# Patient Record
Sex: Female | Born: 1950 | Race: White | Hispanic: No | Marital: Married | Smoking: Never smoker
Health system: Southern US, Community
[De-identification: ages and names within clinical notes are randomized; demographics above are authoritative.]

## PROBLEM LIST (undated history)

## (undated) DIAGNOSIS — K589 Irritable bowel syndrome without diarrhea: Secondary | ICD-10-CM

## (undated) DIAGNOSIS — C569 Malignant neoplasm of unspecified ovary: Secondary | ICD-10-CM

## (undated) DIAGNOSIS — I10 Essential (primary) hypertension: Secondary | ICD-10-CM

## (undated) DIAGNOSIS — R413 Other amnesia: Secondary | ICD-10-CM

## (undated) HISTORY — DX: Essential (primary) hypertension: I10

## (undated) HISTORY — DX: Other amnesia: R41.3

## (undated) HISTORY — DX: Irritable bowel syndrome, unspecified: K58.9

## (undated) HISTORY — DX: Malignant neoplasm of unspecified ovary: C56.9

---

## 2001-04-26 ENCOUNTER — Other Ambulatory Visit: Admission: RE | Admit: 2001-04-26 | Discharge: 2001-04-26 | Payer: Self-pay | Admitting: Obstetrics and Gynecology

## 2001-05-01 ENCOUNTER — Ambulatory Visit (HOSPITAL_COMMUNITY): Admission: RE | Admit: 2001-05-01 | Discharge: 2001-05-01 | Payer: Self-pay | Admitting: Obstetrics and Gynecology

## 2001-05-01 ENCOUNTER — Encounter: Payer: Self-pay | Admitting: Obstetrics and Gynecology

## 2001-05-21 ENCOUNTER — Ambulatory Visit: Admission: RE | Admit: 2001-05-21 | Discharge: 2001-05-21 | Payer: Self-pay | Admitting: Gynecology

## 2001-05-24 ENCOUNTER — Encounter: Payer: Self-pay | Admitting: Gynecologic Oncology

## 2001-05-28 ENCOUNTER — Inpatient Hospital Stay (HOSPITAL_COMMUNITY): Admission: RE | Admit: 2001-05-28 | Discharge: 2001-05-31 | Payer: Self-pay | Admitting: Gynecologic Oncology

## 2001-05-28 ENCOUNTER — Encounter (INDEPENDENT_AMBULATORY_CARE_PROVIDER_SITE_OTHER): Payer: Self-pay

## 2001-06-04 ENCOUNTER — Ambulatory Visit: Admission: RE | Admit: 2001-06-04 | Discharge: 2001-06-04 | Payer: Self-pay | Admitting: Gynecology

## 2001-06-12 ENCOUNTER — Ambulatory Visit: Admission: RE | Admit: 2001-06-12 | Discharge: 2001-06-12 | Payer: Self-pay | Admitting: Gynecology

## 2001-07-09 ENCOUNTER — Ambulatory Visit: Admission: RE | Admit: 2001-07-09 | Discharge: 2001-07-09 | Payer: Self-pay | Admitting: Gynecology

## 2001-08-28 ENCOUNTER — Ambulatory Visit: Admission: RE | Admit: 2001-08-28 | Discharge: 2001-08-28 | Payer: Self-pay | Admitting: Gynecology

## 2001-09-20 ENCOUNTER — Encounter: Payer: Self-pay | Admitting: Gynecology

## 2001-09-20 ENCOUNTER — Ambulatory Visit (HOSPITAL_COMMUNITY): Admission: RE | Admit: 2001-09-20 | Discharge: 2001-09-20 | Payer: Self-pay | Admitting: Gynecology

## 2001-12-13 ENCOUNTER — Ambulatory Visit (HOSPITAL_COMMUNITY): Admission: RE | Admit: 2001-12-13 | Discharge: 2001-12-13 | Payer: Self-pay | Admitting: Gynecology

## 2001-12-13 ENCOUNTER — Encounter: Payer: Self-pay | Admitting: Gynecology

## 2002-02-11 ENCOUNTER — Ambulatory Visit: Admission: RE | Admit: 2002-02-11 | Discharge: 2002-02-11 | Payer: Self-pay | Admitting: Gynecology

## 2002-08-13 ENCOUNTER — Ambulatory Visit: Admission: RE | Admit: 2002-08-13 | Discharge: 2002-08-13 | Payer: Self-pay | Admitting: Gynecology

## 2003-02-11 ENCOUNTER — Ambulatory Visit: Admission: RE | Admit: 2003-02-11 | Discharge: 2003-02-11 | Payer: Self-pay | Admitting: Gynecology

## 2003-08-05 ENCOUNTER — Ambulatory Visit: Admission: RE | Admit: 2003-08-05 | Discharge: 2003-08-05 | Payer: Self-pay | Admitting: Gynecology

## 2003-08-12 ENCOUNTER — Ambulatory Visit (HOSPITAL_COMMUNITY): Admission: RE | Admit: 2003-08-12 | Discharge: 2003-08-12 | Payer: Self-pay | Admitting: Gynecology

## 2004-04-27 ENCOUNTER — Ambulatory Visit: Admission: RE | Admit: 2004-04-27 | Discharge: 2004-04-27 | Payer: Self-pay | Admitting: Gynecology

## 2004-07-13 ENCOUNTER — Ambulatory Visit: Payer: Self-pay | Admitting: Oncology

## 2004-12-28 ENCOUNTER — Ambulatory Visit: Payer: Self-pay | Admitting: Oncology

## 2005-03-29 ENCOUNTER — Ambulatory Visit: Payer: Self-pay | Admitting: Oncology

## 2005-04-07 ENCOUNTER — Ambulatory Visit: Admission: RE | Admit: 2005-04-07 | Discharge: 2005-04-07 | Payer: Self-pay | Admitting: Gynecology

## 2005-06-14 ENCOUNTER — Ambulatory Visit: Payer: Self-pay | Admitting: Oncology

## 2005-11-29 ENCOUNTER — Ambulatory Visit: Payer: Self-pay | Admitting: Oncology

## 2006-04-27 ENCOUNTER — Ambulatory Visit: Payer: Self-pay | Admitting: Oncology

## 2006-05-04 ENCOUNTER — Ambulatory Visit: Admission: RE | Admit: 2006-05-04 | Discharge: 2006-05-04 | Payer: Self-pay | Admitting: Gynecology

## 2006-12-06 ENCOUNTER — Ambulatory Visit: Payer: Self-pay | Admitting: Oncology

## 2007-04-23 ENCOUNTER — Ambulatory Visit: Payer: Self-pay | Admitting: Oncology

## 2007-05-01 ENCOUNTER — Ambulatory Visit: Admission: RE | Admit: 2007-05-01 | Discharge: 2007-05-01 | Payer: Self-pay | Admitting: Gynecology

## 2008-04-15 ENCOUNTER — Ambulatory Visit: Admission: RE | Admit: 2008-04-15 | Discharge: 2008-04-15 | Payer: Self-pay | Admitting: Gynecology

## 2009-04-05 ENCOUNTER — Encounter: Admission: RE | Admit: 2009-04-05 | Discharge: 2009-04-05 | Payer: Self-pay | Admitting: Gastroenterology

## 2009-06-04 ENCOUNTER — Ambulatory Visit: Admission: RE | Admit: 2009-06-04 | Discharge: 2009-06-04 | Payer: Self-pay | Admitting: Gynecology

## 2010-08-03 ENCOUNTER — Ambulatory Visit
Admission: RE | Admit: 2010-08-03 | Discharge: 2010-08-03 | Payer: Self-pay | Source: Home / Self Care | Admitting: Gynecology

## 2010-09-11 HISTORY — PX: VAGINAL HYSTERECTOMY: SUR661

## 2011-01-24 NOTE — Consult Note (Signed)
Whitney Thomas, BURAS             ACCOUNT NO.:  [id number]   MEDICAL RECORD NO.:  [id number]          PATIENT TYPE:  OUT   LOCATION:  GYN                          FACILITY:  [facility]   PHYSICIAN:  De Blanch, M.D.DATE OF BIRTH:  1951/03/26   DATE OF CONSULTATION:  04/15/2008  DATE OF DISCHARGE:                                 CONSULTATION   CHIEF COMPLAINT:  Ovarian cancer.   The patient returns today for continuing follow-up.  Since her last  visit, she has done well.  She denies any GI or GU symptoms, has no  pelvic pain, pressure, vaginal bleed or discharge.  She continues to use  Effexor with good management of her menopausal symptoms.  She has had  mammograms over the past year which were normal.   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  The patient underwent initial surgical resection and staging September  2002.  She subsequently received 3 cycles of carboplatin and Taxol  chemotherapy completed in December 2002.  Since then, she has been  followed with no evidence of recurrent disease.  She has had CA-125  values obtained which remain in the normal range.  Her last one was on  April 13, 2008, which was 11.4 units/mL.   PAST MEDICAL HISTORY:  Medical illnesses - none.   PAST SURGICAL HISTORY:  1. Exploratory laparotomy.  2. TAH-BSO and pelvic and periaortic lymphadenectomy and omentectomy      2002.   OBSTETRICAL HISTORY:  Gravida 1.   CURRENT MEDICATION:  Effexor 50 mg q.a.m.   DRUG ALLERGIES:  None.   SOCIAL HISTORY:  The patient is married.  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  She does not smoke.   FAMILY HISTORY:  Negative for gynecologic, breast or colon cancer.   REVIEW OF SYSTEMS:  A 10-point comprehensive review of systems negative  except as noted above.   PHYSICAL EXAMINATION:  VITAL SIGNS:  Weight 118 pounds, blood pressure  110/70, pulse 80, respiratory rate 20.  GENERAL:  The patient is a healthy  slender white female in no acute  distress.  HEENT:  Negative.  NECK:  Supple without thyromegaly.  There is no supraclavicular or  inguinal adenopathy.  ABDOMEN:  Soft, nontender.  No mass, organomegaly, ascites or hernias  noted.  PELVIC:  EGBUS, vagina, bladder and urethra are normal.  Cervix and  uterus surgically absent.  Bimanual and rectovaginal exam reveal no  adnexal masses, nodularity or tenderness.  LOWER EXTREMITIES:  Without edema or varicosities.   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   PLAN:  The patient will see Dellia Beckwith, M.D., in 6 months and  return to see [place] in 1 year.  We will continue to monitor CA-125.   The patient is strongly encouraged to go ahead and obtain colonoscopy in  the very near future.      De Blanch, M.D.  Electronically Signed     DC/MEDQ  D:  04/15/2008  T:  04/15/2008  Job:  [id number]  cc:   Telford Nab, R.N.  [address]  [place], [place] [address]   Dellia Beckwith, M.D.  Fax: [phone]

## 2011-01-24 NOTE — Consult Note (Signed)
Whitney Thomas, Whitney Thomas             ACCOUNT NO.:  [id number]   MEDICAL RECORD NO.:  [id number]          PATIENT TYPE:  OUT   LOCATION:  GYN                          FACILITY:  [facility]   PHYSICIAN:  De Blanch, M.D.DATE OF BIRTH:  August 22, 1951   DATE OF CONSULTATION:  DATE OF DISCHARGE:                                 CONSULTATION   CHIEF COMPLAINT:  Ovarian cancer.   INTERVAL HISTORY:  The patient returns for her annual examination and  followup of her ovarian cancer.  Since her last visit, she has done  well.  She denies any GI or GU symptoms, has no pelvic pain, pressure,  vaginal bleeding or discharge.  She continues to take Effexor for  management of menopausal symptoms and takes it in the morning  predominantly, to reduce her hot flashes during the day.  She had a  mammogram, was within the past year that were negative.  She has never  had colonoscopy.   HISTORY OF PRESENT ILLNESS:  Stage I-C clear cell carcinoma of the  ovary, undergoing initial surgical resection and staging September 2002.  She subsequently received 3 cycles of Carboplatin and Taxol  chemotherapy, completed in December of 2002.  She had been followed  since that time, with no evidence of recurrent disease.   She has recently had a CA-125 value on April 23, 2007, which was 12.9  units per mL.   PAST MEDICAL HISTORY:  Medical illnesses:  None.   PAST SURGICAL HISTORY:  Exploratory laparotomy, TH-BSO and staging for  ovarian cancer in 2002.   OBSTETRIC HISTORY:  Gravida 1.   CURRENT MEDICATIONS:  Effexor 50 mg q.a.m.   DRUG ALLERGIES:  NONE.   SOCIAL HISTORY:  The patient is married.  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  She does not smoke.   FAMILY HISTORY:  Negative for gynecologic, breast or colon cancer.   REVIEW OF SYSTEMS:  A 10-point comprehensive review of systems negative,  except as noted above.   PHYSICAL EXAMINATION:  VITAL SIGNS:  Weight 122 pounds, blood pressure  120/60,  pulse 80, respiratory rate 20.  GENERAL:  The patient is a healthy white female in no acute distress.  HEENT:  Negative.  NECK:  Supple without thyromegaly.  There is no supraclavicular or  inguinal adenopathy.  ABDOMEN:  Soft, nontender.  No masses, organomegaly, ascites or hernias  are noted.  PELVIC:  EG/BUS, vagina, bladder, urethra are normal.  Cervix and uterus  surgically absent.  Adnexa without masses.  Rectovaginal exam confirms.  LOWER EXTREMITIES:  Without edema or varicosities.   IMPRESSION:  Stage I-C clear cell carcinoma of the ovary, 2002.  No  evidence of recurrent disease.  The patient returns to see Dr. Gery Pray in 6 months, return to see [place] in one year.      De Blanch, M.D.  Electronically Signed     DC/MEDQ  D:  05/01/2007  T:  05/02/2007  Job:  [id number]   cc:   Telford Nab, R.N.  [address]  [place], [place] [address]   Miguel Aschoff, M.D.  Fax: [phone]   Dellia Beckwith, M.D.  Fax: [phone]

## 2011-01-27 NOTE — Consult Note (Signed)
NAME:  Whitney Thomas, Whitney Thomas                       ACCOUNT NO.:  [id number]   MEDICAL RECORD NO.:  [id number]                   PATIENT TYPE:  OUT   LOCATION:  GYN                                  FACILITY:  [facility]   PHYSICIAN:  De Blanch, M.D.         DATE OF BIRTH:  1950-10-15   DATE OF CONSULTATION:  DATE OF DISCHARGE:                                   CONSULTATION   HISTORY OF PRESENT ILLNESS:  This 60 year old white female returns for  continuing follow-up of stage IC clear cell carcinoma of the ovary.  Initial  diagnosis in staging was in September 2002.  Subsequently she received three  cycles of adjuvant Carboplatin and Taxol chemotherapy.  This was completed  in December of 2002.  She has been followed since that time with no evidence  of recurrent disease.  At her last visit with Dr. Gilman Buttner she had a normal  CA-125.   Currently she denies any GI or GU symptoms, has no pelvic pain, pressure,  vaginal or urinary discharge.  Her functional status was excellent.  She is  working full time.   FAMILY HISTORY/SOCIAL HISTORY:  Family history and social history are  reviewed and unchanged.   REVIEW OF SYSTEMS:  Reveals no cardiovascular, pulmonary, GI, GU,  neurologic, or musculoskeletal symptoms.   PHYSICAL EXAMINATION:  VITAL SIGNS:  Weight 123 pounds, blood pressure  120/70, pulse 80.  GENERAL:  The patient is a healthy white female in no acute distress.  HEENT:  Negative.  NECK:  Supple without thyromegaly.  There is no supraclavicular or inguinal  adenopathy.  ABDOMEN:  Soft, nontender, no mass or organomegaly, ascites, or hernias are  noted.  Midline incision is well-healed.  PELVIC:  EGBUS, vagina, bladder, and urethra are normal.  Cervix and uterus  surgically absent.  Adnexa without masses.  RECTAL:  Anus normal, stools guaiac negative.  EXTREMITIES:  Lower extremities are without edema or varicosities.   IMPRESSION:  Stage IC ovarian cancer, September  2002.  No evidence of  recurrent disease.   PLAN:  CA-125 will be obtained today.  The patient will return to see Dr.  Gilman Buttner in three months and return to see [place] in six months.                                                 De Blanch, M.D.    DC/MEDQ  D:  08/13/2002  T:  08/13/2002  Job:  [id number]   cc:   Dellia Beckwith, M.D.  [address]  [place]  [place] [address]  Fax: [phone]   Telford Nab, R.N.  [address] [place], [place] [address]  Fax: [phone]   Miguel Aschoff, M.D.  [address]  [place]  [place]  [address]  Fax:  [phone]  

## 2011-01-27 NOTE — Consult Note (Signed)
[facility]  Patient:    [name], Whitney Thomas Visit Number: [id number] MRN: [id number]          Service Type: GON Location: GYN Attending Physician:  Jeannette Corpus Dictated by:   Rande Brunt. Clarke-Pearson, M.D. Proc. Date: 06/12/01 Admit Date:  06/12/2001   CC:         Miguel Aschoff, M.D.  Dellia Beckwith, M.D.  [facility] [address] [place], [place] [address]   Consultation Report  REASON FOR CONSULTATION:  The patient is a 62 year old white female who returns to discuss adjuvant treatment of a stage IC clear cell carcinoma of the ovary.  I saw her last on June 05, 2001, at which time we had a lengthy discussion, and she was given information regarding GOG protocol for early stage ovarian cancer patients.  In the interim, the patient has had a number of family members give their input as to treatment, and she herself is concerned about hair loss and when she can return to work.  She is scheduled to see Dr. Gery Pray on June 20, 2001.  The patient has decided not to participate in the GOG protocol.  I therefore recommended that she be treated on the "standard" arm of the protocol which would be three cycles of carboplatin and Taxol.  I believe she could get started with this at any point in time.  The patient had a number of questions regarding side effects and ways to avoid side effects, details regarding how the chemotherapy is administered, and her expectation as to how much work she could do once she has recovered from surgery.  All of these questions are answered.  Consultation time was approximately 40 minutes.  They will see Dr. Gilman Buttner on June 20, 2001, and I will have her return to see me for a six week postoperative checkup, and then see her every three months. Dictated by:   Rande Brunt. Clarke-Pearson, M.D. Attending Physician:  Jeannette Corpus DD:  06/12/01 TD:  06/12/01 Job:  [id number]

## 2011-01-27 NOTE — Discharge Summary (Signed)
[facility]  Patient:    Whitney Thomas, Whitney Thomas Visit Number: [id number] MRN: [id number]          Service Type: GON Location: GYN Attending Physician:  Jeannette Corpus Dictated by:   Miguel Aschoff, M.D. Admit Date:  07/09/2001 Discharge Date: 07/09/2001                             Discharge Summary  ADMISSION DIAGNOSIS:  Large abdominal mass.  FINAL DIAGNOSIS:  Clear cell carcinoma of the ovary, probable stage IC.  OPERATIONS AND PROCEDURES: 1. Exploratory laparotomy. 2. Total abdominal hysterectomy. 3. Bilateral salpingo-oophorectomy. 4. Surgical staging of ovarian carcinoma.  BRIEF HISTORY:  The patient is a 60 year old white female, gravida 1, para 1, who on routine evaluation was noted to have a large abdominal mass. Ultrasound examination revealed the mass to be 21 x 11 x 13 cm.  In addition, the patients CA-125 was noted to be elevated at 70.  Her Pap smear was normal.  Because of this large mass, she was admitted to undergo exploratory laparotomy to rule out an ovarian malignancy.  HOSPITAL COURSE:  Preoperative studies were obtained.  This included an admission hemoglobin of 11.1, a hematocrit of 31.9, and a white count of 5700. The chemistry profile was essentially within normal limits.  The blood type was noted to be B+.  On May 30, 2001, under general anesthesia, an exploratory laparotomy was carried out.  The large mass was noted to be that of clear cell ovarian cancer.  During the surgical procedure, the large mass was ruptured and this was felt to account for the positive cytology.  The patient underwent pelvic and periaortic lymphadenectomy, omentectomy, and surgical staging.  Her postoperative course was essentially uncomplicated. She did tolerate increasing ambulation well.  DISPOSITION:  By May 31, 2001, she was in satisfactory condition and able to be sent home.  DISCHARGE MEDICATIONS: 1. Tylox one  every three hours as needed for pain. 2. Chromagen one p.o. daily to reconstitute her hemoglobin.  DIET:  She was sent home on a regular diet.  ACTIVITY:  Instructed to do no heavy lifting.  FOLLOW-UP:  The patient has an appointment to see Reuel Boom L. Clarke-Pearson, M.D., on June 04, 2001, to have her staples removed and to undergo consultation regarding further therapy for her ovarian carcinoma. Dictated by:   Miguel Aschoff, M.D. Attending Physician:  Jeannette Corpus DD:  07/13/01 TD:  07/15/01 Job: [id number]

## 2011-01-27 NOTE — Consult Note (Signed)
[facility]  Patient:    Whitney Thomas, Whitney Thomas Visit Number: [id number] MRN: [id number]          Service Type: GON Location: GYN Attending Physician:  Jeannette Corpus Dictated by:   Rande Brunt. Clarke-Pearson, M.D. Proc. Date: 07/09/01 Admit Date:  07/09/2001   CC:         Miguel Aschoff, M.D.  Dellia Beckwith, M.D.  Telford Nab, R.N.   Consultation Report  The patient is a 60 year old white female who returns for continuing followup, now approximately six weeks after undergoing surgical resection and staging of a stage IC clear cell carcinoma of the ovary.  She has now received one cycle of carboplatin and Taxol under the direction of Dr. Gery Pray in [place].  She tolerated the therapy reasonably well with approximately four days of symptoms which then resolved.  From a surgical point of view the patient is doing well.  Her energy level is good.  She is returning to nearly normal levels of activity.  She has no GI or GU symptoms.  REVIEW OF SYSTEMS:  Otherwise negative.  PHYSICAL EXAMINATION:  VITAL SIGNS:  Weight 107 pounds, blood pressure 122/68.  GENERAL:  The patient is a slender healthy white female in no acute distress.  HEENT:  Negative.  NECK:  Supple without thyromegaly.  ABDOMEN:  Soft, nontender, midline incision is well healed.  No masses, organomegaly, ascites, or hernias are noted.  PELVIC:  EGBUS normal.  The vagina is clean.  The cuff is still healing. Bimanual examination reveals postoperative induration without any discrete masses.  Rectovaginal examination confirms.  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  We plan on continuing her chemotherapy for two additional cycles.  She will return to the care of Dr. Gery Pray.  I will plan on seeing the patient back again  approximately 3 to 4 weeks after her last cycle of chemotherapy.  From a surgical point of view, the patient is given the okay to return to full levels of activity. Dictated by:   Rande Brunt. Clarke-Pearson, M.D. Attending Physician:  Jeannette Corpus DD:  07/10/01 TD:  07/10/01 Job: [id number]

## 2011-01-27 NOTE — Consult Note (Signed)
[facility]  Patient:    Whitney Thomas, Whitney Thomas Visit Number: [id number] MRN: [id number]          Service Type: GON Location: GYN Attending Physician:  Jeannette Corpus Dictated by:   Rande Brunt. Clarke-Pearson, M.D. Proc. Date: 05/31/01 Admit Date:  06/04/2001   CC:         Miguel Aschoff, M.D.  Dellia Beckwith, M.D., [facility], [place], [place]  Telford Nab, R.N.   Consultation Report  REASON FOR CONSULTATION:  [age]-year-old white female returns for initial postoperative followup and treatment planning.  She underwent exploratory laparotomy for a large pelvis mass on September 17th.  She was found to have a stage IC clear cell carcinoma of the ovary.  The ovarian capsule was ruptured as well as patient having positive cytology.  Extensive staging biopsies showed no other evidence of metastatic disease.  The patient had an uncomplicated postoperative course.  PHYSICAL EXAMINATION:  ABDOMEN:  Soft and nontender.  Her midline incision is healing well.  Staples are removed, Steri-Strips are applied.  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  PLAN:  I had a lengthy consultation with the patient and her husband regarding the surgical findings, pathologic findings, natural history of ovarian cancer and recommendations for adjuvant chemotherapy.  I indicated that standard of care would be to administer three cycles of carboplatin and Taxol chemotherapy.  I also provided them detailed information regarding the current GOG protocol utilizing carboplatin and Taxol for 3 cycles, followed by 24 weekly low-dose cycles of Taxol versus standard therapy of just 3 cycles of carboplatin and Taxol.  The side-effects of chemotherapy regimens were discussed in detail.  Patient and her husband were given the information sheet to consider and we will plan on meeting again next  week to make decisions about further therapy.  I did indicate that patient could certainly be treated very easily in [place] by Dr. Baxter Flattery.  All of their questions were answered and I will be prepared to answer more questions in a week. Dictated by:   Rande Brunt. Clarke-Pearson, M.D. Attending Physician:  Jeannette Corpus DD:  06/04/01 TD:  06/04/01 Job: [id number]

## 2011-01-27 NOTE — Op Note (Signed)
[facility]  Patient:    Whitney Thomas, Whitney Thomas Visit Number: [id number] MRN: [id number]          Service Type: GYN Location: 1S 0005 01 Attending Physician:  Sabino Donovan Dictated by:   Jackquline Denmark. Kyla Balzarine, M.D. Proc. Date: 05/28/01 Admit Date:  05/28/2001   CC:         Miguel Aschoff, M.D.  Telford Nab, R.N., Gyn Oncology unit at [facility]   Operative Report  PREOPERATIVE DIAGNOSIS:  Complex pelvic mass with elevated cancer antigen 125.  POSTOPERATIVE DIAGNOSIS:  Clear cell carcinoma of the ovary, at least stage I-C.  PROCEDURE:  TAH/BSO, omentectomy, multiple peritoneal biopsies and washings, bilateral pelvic and periaortic selective lymphadenectomy, appendectomy.  SURGEON:  John T. Kyla Balzarine, M.D.  ASSISTANT:  Miguel Aschoff, M.D.  ANESTHESIA:  General endotracheal.  FINDINGS AND INDICATIONS FOR PROCEDURE:  This is a 60 year old woman who presented with a large, complex cystic mass extending above the level of the umbilicus associated with an elevated CA 125 value.  At the time that the abdominal cavity was opened, a 20+ cm cystic mass replaced the left ovary, and was densely adherent to the left descending/sigmoid colon, pelvic side wall, and with reactive retroperitoneal fibrosis involving the left external iliac vessels and ureter.  During the course of dissection, this ruptured with spill of dark, old hemorrhagic fluid.  The uterus was essentially normal.  The right ovary had scarring and powder burn changes compatible with prior endometriosis.  Unfortunately, final frozen section results revealed a clear cell carcinoma of the left ovary.  There was no other gross evidence of metastatic disease by inspection or palpation of the intra-abdominal and retroperitoneal contents.  Comprehensive surgical staging for ovarian cancer was carried out with omentectomy, multiple peritoneal biopsies, omentectomy, washings, and both pelvic and  periaortic lymphadenectomy.  Incidental appendectomy was performed because of the high yield for occult metastatic disease in ovarian cancer patients.  DESCRIPTION OF PROCEDURE:  The patient was prepped and draped in the low lithotomy position using direct placement strips.  She was explored through a midline incision which was developed with sharp dissection and electrocautery, extending to the left at the umbilicus into the midepigastrium.  Manual and visual exploration of the abdominal cavity revealed the findings described above.  There was no ascites and a 20 cm smooth mass was densely adherent to the descending colon and the left pelvic side wall.  The lateral peritoneal reflection on the left side was opened, and with sharp and blunt dissection, ureters, and the iliac vessels identified along with the infundibulopelvic ligament.  This was skeletonized above the left ureter, cross-clamped, divided, and suture ligated.  All sutures and ligatures were 2-0 Vicryl unless otherwise specified.  Using sharp and blunt dissection, adhesions between the ovary and mesentery of the sigmoid colon were lysed.  During this dissection, there was spontaneous rupture of the cyst.  Sharp and blunt dissection was also used to liberate the ovary from the left external iliac vessels, left ureter, and left pelvic side wall.  The left utero-ovarian ligament and fallopian tubes were then cross-clamped and divided with the specimen handed off for frozen section pathology.  Bowel was packed out of the pelvis and a self-retaining retractor was used for exposure.  Total abdominal hysterectomy and right salpingo-oophorectomy was performed using 2-0 Vicryl for all sutures and ligatures unless otherwise specified.  The round ligaments bilaterally were divided with electrocautery.  The right posterior broad ligament was opened with electrocautery  and perirectal space partially developed.  The right ureter was  identified.  The right infundibulopelvic ligament was isolated the above the ureter, cross-clamped, divided, and ligated with free tie and suture ligature.  Adhesions of the right ovary to the posterior pelvis were lysed with sharp and blunt dissection, with the ureter under direct vision.  The anterior peritoneal reflection of the uterus was opened and uterine vessels bilaterally skeletonized, cross-clamped, divided, and suture ligated.  Alternating on the left and right sides, cardinal and uterosacral ligaments were developed by clamping adjacent to the cervix, dividing, and suture ligating.  Final pedicles entered the lateral fornices of the vagina.  The uterine specimen was amputated and vaginal apex was closed with locked running suture line.  At this juncture, frozen section returned as above.  Comprehensive surgical staging for ovarian cancer was performed.  Washings were obtained from all quadrants of the abdomen, Pap smear from the diaphragm, and pinch biopsies of the diaphragm were obtained.  The Bookwalter retractor was placed to afford greater exposure.  Multiple random biopsies were obtained from the site of adherence to the descending/sigmoid colon, left pelvic side wall, and additional biopsies were obtained from the anterior and posterior cul-de-sac and right pelvic side wall.  Strips of peritoneum from the right and left pericolic gutter were harvested.  The bowel was run in its entirety and biopsies were obtained from the mesentery of the ileum.  Partial omentectomy was performed, developing pedicles that were cross-clamped, divided, and suture ligated.  Bilateral selective pelvic lymphadenectomies were performed in the following manner; the paravesical and pararectal spaces were fully developed and final structures identified.  Lymph nodes anterior and medial to the external iliac vessels were harvested using sharp and blunt dissection with electrocautery for  hemostasis.  The external iliac vein was retracted anterior, obturator  nerve identified, and lymphatic tissue above the level of the obturator nerve harvested from the obturator space using sharp and blunt dissection with electrocautery for hemostasis.  Hot lap pads were placed in the pelvis.  The peritoneum over the root at the aorta was opened to the level of the retroperitoneal duodenum and above.  Using sharp and blunt dissection, the distal aorta was identified and right ureter retracted laterally, exposing the distal aorta and right common iliac vessels.  Using sharp and blunt dissection, lymph nodes anterior and lateral to the common iliac artery and distal aorta were harvested, clearing nodes off of the anterior vena cava. The dissection extended above the level of the retroperitoneal duodenum and inferior mesenteric artery.  No pathologically enlarged lymph nodes were encountered.  Hemoclips were used for hemostasis throughout.  The dissection continued across the presacral space and under the inferior mesenteric artery.  The left ureter was retracted anteriorly along with the sigmoid colon.  Using sharp and blunt dissection, the left common iliac lymph nodes anterior and lateral to this artery and left periaortic lymph nodes anterior and left of the aorta were harvested, extending well above the inferior mesenteric artery on that side.  Further retroperitoneal exploration revealed no pathologic lymphadenopathy.  Because of the high incidence of occult appendiceal involvement, it was elected to perform incidental appendectomy.  The appendix was elevated and the mesentery controlled with electrocautery, and the base of the appendix was crushed, cross-clamped, divided, and suture ligated.  All packs and retractors were removed after inspecting the operative sites for hemostasis.  The abdomen and pelvis were copiously irrigated with normal saline.  The abdominal wall was closed  in  layers, irrigating between layers, and assuring hemostasis.  A looped #1 PDS was used to close the rectus muscles and fascia, and skin clips were applied to the skin.  The patient tolerated the procedure well and was returned to the recovery room in stable condition.  Estimated blood loss was 350 cc.  Transfusion none. Drains, packs, _________ to dependent drainage.  Sponge and instrument counts correct. Dictated by:   Jackquline Denmark. Kyla Balzarine, M.D. Attending Physician:  Ronita Hipps T DD:  05/28/01 TD:  05/28/01 Job: [id number]

## 2011-01-27 NOTE — Consult Note (Signed)
[facility]  Patient:    Whitney Thomas, Whitney Thomas Visit Number: [id number] MRN: [id number]          Service Type: GON Location: GYN Attending Physician:  Jeannette Corpus Dictated by:   Rande Brunt. Clarke-Pearson, M.D. Proc. Date: 08/28/01 Admit Date:  08/28/2001   CC:         Dellia Beckwith, M.D.  Miguel Aschoff, M.D.  Telford Nab, R.N.   Consultation Report  REASON FOR CONSULTATION:  The patient is a 60 year old white female who returns now having received three cycles of carboplatin and Taxol chemotherapy for stage 1C clear cell carcinoma of the ovary.  Her last cycle of chemotherapy was administered on August 13, 2001, under the direction of Dr. Gery Pray.  CA125 at that time was 14.8 units/mL.  The patient tolerated her first two cycles of chemotherapy well, the last cycle she had more nausea and vomiting, and muscles aches and joint pains.  She is obviously very happy to be done with her chemotherapy.  Currently, her only complaint is that of some abdominal pain, especially in the right lower quadrant and left upper quadrant.  She denies any pelvic pain, pressure, GI, or GU symptoms.  Her appetite is good.  REVIEW OF SYSTEMS:  Negative, except as noted above.  SOCIAL HISTORY:  The patient has returned to work full-time.  PHYSICAL EXAMINATION:  VITAL SIGNS:  Weight 107 pounds.  GENERAL:  The patient is a slender healthy white female in no acute distress.  HEENT:  Negative.  NECK:  Supple without thyromegaly.  ABDOMEN:  Soft except for some thickening in the subcutaneous tissues in the left lower quadrant.  This is not truly a pelvic mass, however.  The remainder of the abdomen is relatively soft, nontender, no masses, organomegaly, or ascites noted.  PELVIC:  EGBUS normal.  Vagina is clean, well supported.  Bimanual and rectovaginal examination reveal no masses, induration, or nodularity.  IMPRESSION:   Stage 1C clear cell carcinoma of the ovary, status post three cycles of carboplatin and Taxol chemotherapy.  The patient seems to be clinically free of disease.  I reassured her regarding her pain, suspecting this is most likely secondary to surgical adhesions.  We will obtain a CAT scan in the near future to reestablish a baseline for future comparison.  She will return to see Dr. Gilman Buttner in three months, and return to see me three months thereafter. Dictated by:   Rande Brunt. Clarke-Pearson, M.D. Attending Physician:  Jeannette Corpus DD:  08/28/01 TD:  08/28/01 Job: [id number]

## 2011-01-27 NOTE — Consult Note (Signed)
[facility]  Patient:    Whitney Thomas, Whitney Thomas Visit Number: [id number] MRN: [id number]          Service Type: GON Location: GYN Attending Physician:  Jeannette Corpus Dictated by:   Rande Brunt. Clarke-Pearson, M.D. Proc. Date: 05/21/01 Admit Date:  05/21/2001   CC:         Miguel Aschoff, M.D.  Telford Nab, R.N.   Consultation Report  HISTORY OF PRESENT ILLNESS:  [age]-year-old white married female, gravida 1, referred by Dr. Miguel Aschoff for evaluation and co-management of a large abdominal mass.  The patient first noted an abdominopelvic mass in mid-August and saw Dr. Tenny Craw on August 16th, complaining of left lower quadrant pain.  Subsequently, she had an ultrasound of the abdomen and pelvis which showed a 21 x 11 x 13-cm mass arising from the pelvis containing multiple cystic spaces separated by thick septations and a few nodular projections from the wall of the cystic mass.  The uterus was displaced anteriorly and she had moderate hydronephrosis bilaterally.  No free fluid was noted.  Patient has also had a CA125 value which was 70 and a normal Pap smear.  The patient denies any past gynecologic history.  She is menopausal but not taking any hormone replacement therapy.  On further questioning, the patient has noted some slight edema of her left lower extremity without any pain over the past several days.  PAST MEDICAL HISTORY:  Medical illnesses:  None.  PAST SURGICAL HISTORY:  None.  OBSTETRICAL HISTORY:  Gravida 1, spontaneous vaginal delivery.  CURRENT MEDICATIONS:  None.  DRUG ALLERGIES:  None.  SOCIAL HISTORY:  The patient is married.  She is a [employer] operational officer.  She does not smoke.  FAMILY HISTORY:  Negative for gynecologic, breast or colon cancer.  She had a maternal grandmother who died of liver cancer.  REVIEW OF SYSTEMS:  Otherwise negative.   PHYSICAL EXAMINATION:  VITAL SIGNS:  Height 5 feet 3 inches.  Weight 120  pounds.  Blood pressure 120/70, pulse 88, respiratory rate 18.  GENERAL:  Patient is a healthy, anxious white female in no acute distress.  HEENT:  Negative.  NECK:  Supple without thyromegaly.  NODES:  There is no supraclavicular or inguinal adenopathy.  ABDOMEN:  Soft, nontender.  She has a very large protuberant mass extending several centimeters above the umbilicus.  This is nontender.  She has no CVA tenderness.  PELVIC:  EGBUS normal.  The vagina is normal.  Cervix is sharply deviated anteriorly, as is the uterus.  The remainder of the pelvis is filled by a cystic mass which extends up to several centimeters above the umbilicus. Rectovaginal exam confirms the mass protrudes posteriorly a considerable distance as well.  IMPRESSION: 1. Large complex adnexal mass with a slightly elevated CA125.  Clearly, we    need to exclude the possibility of ovarian cancer. 2. Left lower extremity edema of recent onset.  PLAN:  Patient is scheduled to undergo surgery next week with Dr. Tenny Craw and Dr. Jonny Ruiz T. Soper.  I had a lengthy discussion with the patient regarding surgical approach.  If this is benign, then we would recommend that she undergo a total abdominal hysterectomy and bilateral salpingo-oophorectomy and she is in agreement with this plan.  She is informed that if this turns out to be an ovarian cancer, additional staging procedures will be performed including omentectomy, peritoneal biopsies and pelvic and para-aortic lymphadenectomy.  The possibility of tumor debulking including small bowel resection or colonic  resection was outlined to the patient as well.  All of her questions were answered.  In the interval, she will go to ultrasound today to have her lower extremity evaluated to rule out the possibility of deep venous thrombosis causing her left lower extremity edema. Dictated by:   Rande Brunt. Clarke-Pearson, M.D. Attending Physician:  Jeannette Corpus DD:   05/21/01 TD:  05/21/01 Job: [id number]

## 2011-01-27 NOTE — Consult Note (Signed)
Whitney Thomas, Whitney Thomas                       ACCOUNT NO.:  [id number]   MEDICAL RECORD NO.:  [id number]                   PATIENT TYPE:  OUT   LOCATION:  GYN                                  FACILITY:  [facility]   PHYSICIAN:  De Blanch, M.D.         DATE OF BIRTH:  1951/03/30   DATE OF CONSULTATION:  02/11/2003  DATE OF DISCHARGE:                                   CONSULTATION   A 60 year old white female returns for continuing follow-up of a stage IC  clear cell carcinoma of the ovary.   Since her last visit she has done well.  She denies any GI or GU symptoms.  Her only complaint is that of chronic left mid abdominal pain which has been  present for a number of months.  She has had some trouble with constipation.   HISTORY OF PRESENT ILLNESS:  The patient underwent initial staging for  ovarian cancer September 2002.  Stage IC clear cell carcinoma of the ovary.  She was treated with three cycles of adjuvant carboplatin and Taxol  chemotherapy which was completed in December 2002.   FAMILY HISTORY:  Reviewed and unchanged from previous notation.   SOCIAL HISTORY:  Reviewed and unchanged from previous notation.   REVIEW OF SYSTEMS:  No cardiovascular, pulmonary, GI, GU, neurologic,  musculoskeletal, or gynecologic symptoms.   PHYSICAL EXAMINATION:  VITAL SIGNS:  Weight 125 pounds, blood pressure  130/70.  GENERAL:  The patient is a healthy white female in no acute distress.  HEENT:  Negative.  NECK:  Supple without thyromegaly.  LYMPH:  There is no supraclavicular, axillary, or inguinal adenopathy.  ABDOMEN:  Soft and nontender.  No masses, organomegaly, ascites, or hernias  are noted.  Midline incision is well healed.  No masses are palpable.  PELVIC:  EGBUS, vagina, bladder, urethra are normal.  Cervix and uterus are  surgically absent.  Adnexa without masses.  Rectovaginal examination  confirms.  EXTREMITIES:  Lower extremities without edema or varicosities.   IMPRESSION:  Stage IC clear cell carcinoma of the ovary September 2002.  No  evidence of recurrent disease.   PLAN:  CA-125 is obtained today.  The patient will return to see Dellia Beckwith, M.D. in [place] in three months and return to see [place] in  December 2004.                                               De Blanch, M.D.    DC/MEDQ  D:  02/11/2003  T:  02/11/2003  Job:  [id number]   cc:   Dellia Beckwith, M.D.  [address]  [place]  [place] [address]  Fax: [phone]   Telford Nab, R.N.   Miguel Aschoff, M.D.  [address]  [place]  [place]  [address]  Fax: [phone]

## 2011-01-27 NOTE — Consult Note (Signed)
Whitney Thomas, Whitney Thomas             ACCOUNT NO.:  [id number]   MEDICAL RECORD NO.:  [id number]          PATIENT TYPE:  OUT   LOCATION:  GYN                          FACILITY:  [facility]   PHYSICIAN:  De Blanch, M.D.DATE OF BIRTH:  Nov 10, 1950   DATE OF CONSULTATION:  05/04/2006  DATE OF DISCHARGE:                                   CONSULTATION   CHIEF COMPLAINT:  Ovarian cancer.   INTERVAL HISTORY:  Since her last visit, the patient has done well.  She  denies any GI or GU symptoms.  Has no pelvic pain, pressure, vaginal  bleeding or discharge.  Her functional status is excellent.  She continues  to work full-time.  She is using Effexor for medical management of  menopausal symptoms and is having good relief.   HISTORY OF PRESENT ILLNESS:  The patient has stage IC clear-cell carcinoma  of the ovary initially diagnosed in September 2002.  She subsequently  received three cycles of carboplatin and Taxol chemotherapy completed in  December 2002.  She had been followed since that time with no evidence of  recurrent disease.  CA-125 value on November 29, 2005 was 12 units per mL.  Follow-up CA-125 on April 27, 2006 was 12.4 units per mL.   PAST MEDICAL HISTORY:  Medical illnesses none.   PAST SURGICAL HISTORY:  Staging for ovarian cancer 2002.   OBSTETRICAL HISTORY:  Gravida 1.   CURRENT MEDICATIONS:  Effexor 50 mg q.h.s.   DRUG ALLERGIES:  None.   SOCIAL HISTORY:  The patient is married.  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  She does not smoke.   FAMILY HISTORY:  Negative for gynecologic, breast or colon cancer.   REVIEW OF SYSTEMS:  A 10-point comprehensive review of systems is negative  except as noted above.   PHYSICAL EXAM:  VITAL SIGNS:  Weight 124 pounds.  Blood pressure 120/60,  pulse 80, respiratory rate 20.  GENERAL:  Patient is a healthy white female in no acute distress.  HEENT:  Negative.  NECK:  Supple without thyromegaly.  There is no supraclavicular or  inguinal  adenopathy.  ABDOMEN:  Soft, nontender.  No mass, organomegaly, ascites or hernias are  noted.  PELVIC EXAM:  EG/BUS, vagina, bladder and urethra are normal.  Cervix and  uterus is surgically absent.  Adnexa without masses.  Rectovaginal exam  confirms.  EXTREMITIES:  Lower extremity without edema or varicosities.   IMPRESSION:  Stage IC clear-cell carcinoma of the ovary 2002 no evidence  recurrent disease.   PLAN:  CA-125 is obtained today.  The patient is given renewed prescription  for Effexor 50 mg q.h.s. She will return to see [place] in one year for  continuing follow-up.      De Blanch, M.D.  Electronically Signed     DC/MEDQ  D:  05/04/2006  T:  05/05/2006  Job:  [id number]   cc:   Dellia Beckwith, M.D.  Fax: [phone]   Telford Nab, R.N.  [address]  [place], [place] [address]   Miguel Aschoff, M.D.  Fax: [phone]

## 2011-01-27 NOTE — Consult Note (Signed)
NAMESAMMYE, STAFF             ACCOUNT NO.:  [id number]   MEDICAL RECORD NO.:  [id number]          PATIENT TYPE:  OUT   LOCATION:  GYN                          FACILITY:  [facility]   PHYSICIAN:  De Blanch, M.D.DATE OF BIRTH:  15-Dec-1950   DATE OF CONSULTATION:  DATE OF DISCHARGE:                                   CONSULTATION   This 60 year old white female returns in continuing followup of ovarian  cancer.   INTERVAL HISTORY:  Since her last visit the patient has done well.  She  denies any GI or GU symptoms, has no pelvic pain, pressure, vaginal bleeding  or discharge.  Her menopausal symptoms are managed easily with Effexor.   HISTORY OF PRESENT ILLNESS:  The patient was found to have a stage IC clear  cell carcinoma of ovary in September 2001.  She was treated with 3 cycles of  carboplatin and Taxol completed in December 2002.  She has been followed  since that time with no evidence of recurrent disease.  CA125 on March 29, 2005 was 12.4 units per mL.   PAST MEDICAL HISTORY:  Medical illnesses none.   PAST SURGICAL HISTORY:  Staging of ovarian cancer in 2001.  __________ 3.  Gravida 1.   CURRENT MEDICATIONS:  Effexor 50 mg q.h.s.   DRUG ALLERGIES:  None.   SOCIAL HISTORY:  The patient is married.  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  She does not smoke.   FAMILY HISTORY:  Negative for gynecologic, breast or colon cancer.   REVIEW OF SYSTEMS:  A 10-point comprehensive review of systems is negative  except as noted above.   PHYSICAL EXAMINATION:  VITAL SIGNS:  Weight 126 pounds, blood pressure  120/60.  GENERAL:  The patient is a healthy white female in no acute distress.  HEENT:  Negative.  NECK:  Supple without thyromegaly.  There is no supraclavicular or inguinal  adenopathy.  ABDOMEN:  Soft and nontender.  No mass, organomegaly, ascites or hernias are  noted.  PELVIC:  EG/BUS, vagina, bladder and urethra are normal.  Cuff is well  healed, well  supported.  No lesions are noted.  Bimanual and rectovaginal  exams reveal no masses, induration or nodularity.  EXTREMITIES:  Lower extremities without edema or varicosities.   IMPRESSION:  Stage IC clear cell carcinoma of the ovary in 2001, no evidence  of recurrent disease.   The patient will return to see Dr. Gilman Buttner in 6 months, and I will see in 1  year.  Thereafter we will plan annual gynecologic exams.       DC/MEDQ  D:  04/07/2005  T:  04/07/2005  Job:  [id number]   cc:   Dellia Beckwith, M.D.  [address]  [place]  [place] [address]  Fax: [phone]   Telford Nab, R.N.  [address]  [place], [place] [address]   Miguel Aschoff, M.D.  [address]  [place]  [place] [address]  Fax: [phone]

## 2011-01-27 NOTE — Consult Note (Signed)
[facility]  Patient:    Whitney Thomas, Whitney Thomas Visit Number: [id number] MRN: [id number]          Service Type: GON Location: GYN Attending Physician:  Jeannette Corpus Dictated by:   Rande Brunt Clarke-Pearson, M.D. Proc. Date: 02/11/02 Admit Date:  02/11/2002 Discharge Date: 02/11/2002   CC:         Dellia Beckwith, M.D.; [address] [place], [place] [address]  Miguel Aschoff, M.D.  Telford Nab, R.N.   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  She underwent initial surgical staging in September 2002 and received three cycles of carboplatin and Taxol chemotherapy under the direction of Dr. Gery Pray in [place]  At the completion of her chemotherapy her CA-125 was 14.  Since her last visit she has done well.  She denies any GI or GU symptoms. Her only complaint is that she is having hot flushes.  She has discussed hormone replacement therapy with Dr. Gilman Buttner, but is undecided and seeks my opinion regarding that management.  REVIEW OF SYSTEMS:  Negative except as noted above.  FAMILY HISTORY:  Reviewed and unchanged.  SOCIAL HISTORY:  Reviewed and unchanged.  PHYSICAL EXAMINATION  VITAL SIGNS:  Blood pressure 123/68, weight 120 pounds.  GENERAL:  The patient is a slender, healthy white female in no acute distress.  HEENT:  Negative.  NECK:  Supple without thyromegaly.  LYMPH:  There is no supraclavicular or inguinal adenopathy.  ABDOMEN:  Soft, nontender.  No mass, organomegaly, ascites, or hernias are noted.  Midline incision is well healed.  PELVIC:  EGBUS is normal.  Vagina is clean, well supported.  Bimanual and rectovaginal examinations reveal no masses, induration, or nodularity.  IMPRESSION:  Stage IC clear cell carcinoma of the ovary status post surgical resection and staging and adjuvant chemotherapy.  Clinically free  of disease. CA-125 will be obtained today.  Hot flushes.  We had a lengthy discussion regarding the pros and cons of hormone replacement therapy.  At the conclusion of that the patient wishes to try to avoid hormone replacement therapy.  We will therefore give her a prescription for Effexor 50 mg q.h.s.  She will take this for one month.  If she is not improving in her symptoms she will contact Dr. Gilman Buttner for a dose adjustment.  She will return to see Dr. Gilman Buttner in three months and return to see me in six months. Dictated by:   Rande Brunt. Clarke-Pearson, M.D. Attending Physician:  Jeannette Corpus DD:  02/11/02 TD:  02/13/02 Job: [id number]

## 2011-01-27 NOTE — Consult Note (Signed)
NAMEYELITZA, Whitney Thomas                       ACCOUNT NO.:  [id number]   MEDICAL RECORD NO.:  [id number]                   PATIENT TYPE:  OUT   LOCATION:  GYN                                  FACILITY:  [facility]   PHYSICIAN:  De Blanch, M.D.         DATE OF BIRTH:  1951-05-24   DATE OF CONSULTATION:  04/27/2004  DATE OF DISCHARGE:                                   CONSULTATION   REASON FOR CONSULTATION:  A 60 year old white female returns for continuing  followup of ovarian cancer.  Since her last visit she has done well. She has  had no interval problems. She had a CA 125 obtained on April 20, 2004 which  is 13 units per mL.  In the interval, she has seen Dr. Gery Pray in  [place].   HISTORY OF PRESENT ILLNESS:  The patient is found to have a stage 1C clear  cell carcinoma of the ovary September 2001. She was treated with three  cycles of carboplatin and Taxol chemotherapy completed in December 2002.  Since that time, she has been followed with no evidence of recurrent  disease.   PAST MEDICAL HISTORY:  Medical illnesses none.   PAST SURGICAL HISTORY:  Debulking and staging of ovarian cancer 2002.   OBSTETRICAL HISTORY:  Gravida 1.   CURRENT MEDICATIONS:  None.   ALLERGIES:  None.   SOCIAL HISTORY:  The patient is married, she is a [employer] operational officer. She does not  smoke.   FAMILY HISTORY:  Negative for gynecologic, breast or colon cancer.   REVIEW OF SYMPTOMS:  Negative except as noted above. She did recently have a  cyst excised from her neck.   PHYSICAL EXAMINATION:  VITAL SIGNS:  Weight 126 pounds, blood pressure  126/60.  GENERAL:  The patient is a healthy white female in no acute distress.  HEENT:  Negative.  NECK:  Supple without thyromegaly. There was no supraclavicular or inguinal  adenopathy.  ABDOMEN:  Soft, nontender, no mass, organomegaly, ascites or hernias are  noted.  A midline incision is well healed.  PELVIC:  EGBUS, vagina, bladder,  urethra are normal, cuff is well healed and  supported, no lesions are noted.  Bimanual and rectovaginal exam reveal no  mass, induration or nodularity.   IMPRESSION:  Stage 1C clear cell carcinoma of the ovary September 2002. The  patient remains free of disease and has a normal CA 125.   PLAN:  The patient is to return to see Dellia Beckwith, M.D. in six  months and return to see [place] in one year.                                               De Blanch, M.D.    DC/MEDQ  D:  04/27/2004  T:  04/28/2004  Job:  [id number]  cc:   Dellia Beckwith, M.D.  [address]  [place]  [place] [address]  Fax: [phone]   Miguel Aschoff, M.D.  [address]  [place]  [place] [address]  Fax: [phone]   Telford Nab, R.N.  [phone] [address]  [place], [place] [address]

## 2011-01-27 NOTE — Consult Note (Signed)
NAMEKONICA, STANKOWSKI                       ACCOUNT NO.:  [id number]   MEDICAL RECORD NO.:  [id number]                   PATIENT TYPE:  OUT   LOCATION:  GYN                                  FACILITY:  [facility]   PHYSICIAN:  De Blanch, M.D.         DATE OF BIRTH:  Feb 24, 1951   DATE OF CONSULTATION:  08/05/2003  DATE OF DISCHARGE:                                   CONSULTATION   Patient is a 60 year old white female who returns for continuing follow up  of ovarian cancer.   INTERVAL HISTORY:  Since her last visit, the patient did well until about a  week ago when she developed a dull constant pain in her left mid abdomen.  She denies any other associated GI or GU symptoms.  She has no fever or  chills.  She had a CA-125 done on July 30, 2003, which is 52.5  (previously it was 13 and 15, and the highest she has had is 18).  Otherwise, her physical status has been good.  She denies any abdominal  distention, bloating, or nausea or vomiting.   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  She was treated with three cycles of carboplatin and  Taxol, completed in December of 2002.  At that time her CT scan was normal.   PAST MEDICAL HISTORY:  Medical illnesses, none.   PAST SURGICAL HISTORY:  Staging of ovarian cancer in 2002.   OBSTETRICAL HISTORY:  G1.   CURRENT MEDICATIONS:  None.   ALLERGIES:  None.   SOCIAL HISTORY:  The patient is married.  She is a [employer] operational officer.  She does not  smoke.   FAMILY HISTORY:  Negative for gynecologic, breast or colon cancer.   REVIEW OF SYSTEMS:  Otherwise negative except as noted above.   PHYSICAL EXAMINATION:  VITAL SIGNS:  Weight 126 pounds.  Blood pressure  124/72.  GENERAL:  The patient is a healthy white female in no acute distress.  HEENT:  Negative.  NECK:  Supple without thyromegaly.  There is no  supraclavicular or inguinal  adenopathy.  ABDOMEN:  Soft, nontender.  No masses, organomegaly, ascites, or hernias are  noted.  PELVIC:  EG-BUS, vagina, bladder, urethra are normal.  Cervix and uterus is  surgically absent.  Adnexa without masses or nodularity.  Rectovaginal exam  confirms.  LOWER EXTREMITIES:  Without edema or varicosities.   IMPRESSION:  1. Stage IC clear cell carcinoma of the ovary 2002, no evidence of recurrent     disease.  2. Left abdominal pain, questionable etiology.   PLAN:  Given the patient's new onset of symptoms, would like to re-evaluate  her disease status and will arrange for her to have a CT scan of the abdomen  and pelvis in the very near future.  If the study is  normal, I think we  could then lengthen interval visits to six-month intervals alternating  between Dr. Zipporah Plants and myself.  If there is some abnormality, we will  review this with the patient and make further recommendations.                                               De Blanch, M.D.    DC/MEDQ  D:  08/05/2003  T:  08/05/2003  Job:  [id number]   cc:   Dellia Beckwith, M.D.  [address]  [place]  [place] [address]  Fax: [phone]   Miguel Aschoff, M.D.  [address]  [place]  [place] [address]  Fax: [phone]   Telford Nab, R.N.  [address]  [place], [place] [address]

## 2011-03-08 IMAGING — CT CT VIRTUAL COLONOSCOPY SCREENING
3 of 7 series · 12 of 36 positions shown, 18 images · non-contrast
Comparison: CT abdomen pelvis of 08/12/2003

CLINICAL DATA: Incomplete optical colonoscopy, history of ovarian
carcinoma

CT VIRTUAL COLONOSCOPY FOR SCREENING
TECHNIQUE: The patient was given a standard low so bowel
preparation with Gastrografin and barium for fluid and stool
tagging respectively.  The quality of the bowel preparation is
moderate.  Automated CO2 insufflation of the colon was performed
prior to image acquisition and colonic distention is moderate.
Image post processing was used to generate a 3D endoluminal fly-
through projection of the colon and to electronically subtract
stool/fluid as appropriate.

[Series 2: supine · axial · 0.62mm/px · z∈[-333,-2]mm · 7 of 355 slices shown, 12 images]
[im 45/355  soft-tissue]
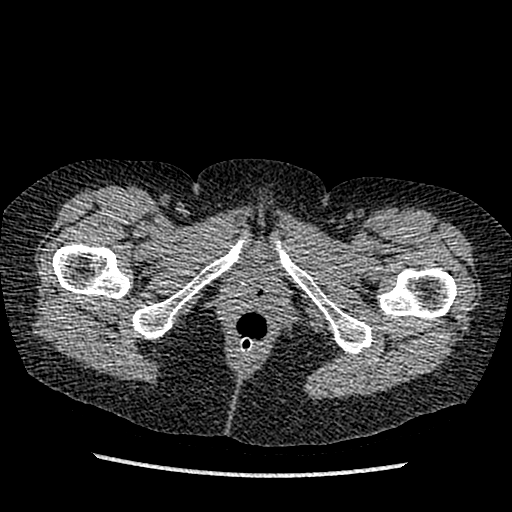
[im 45/355  bone]
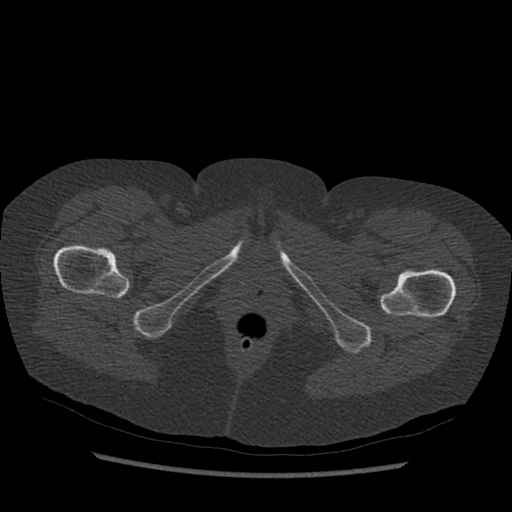
[im 89/355  soft-tissue]
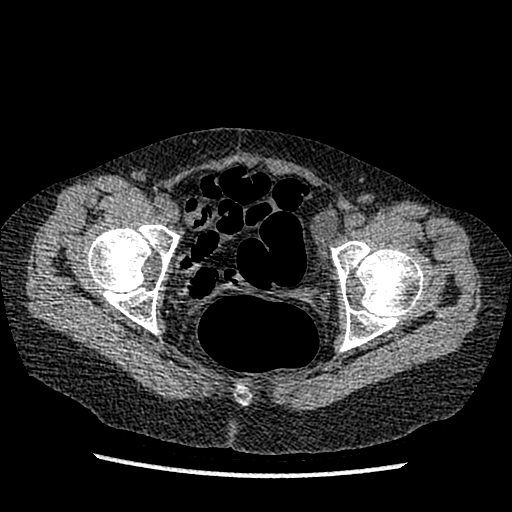
[im 133/355  soft-tissue]
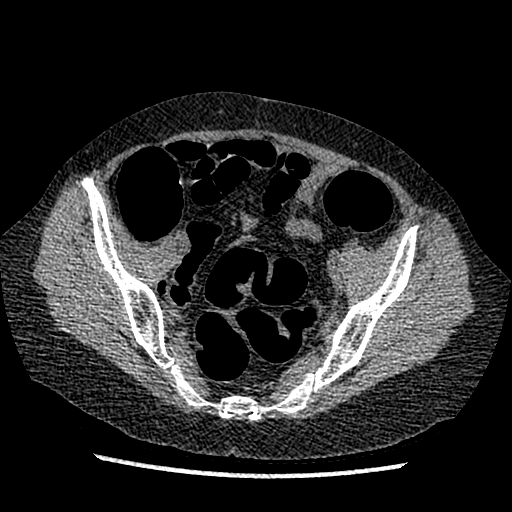
[im 178/355  soft-tissue]
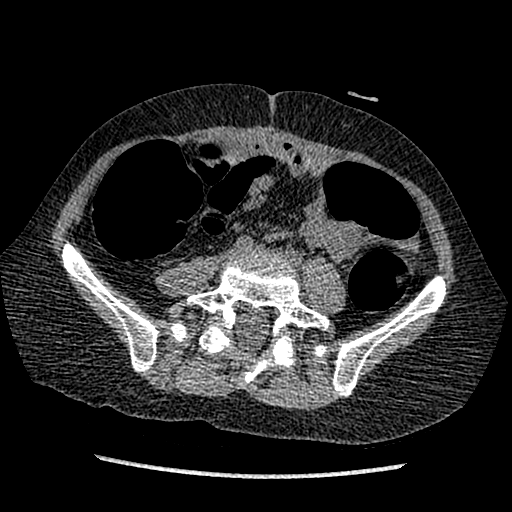
[im 178/355  lung]
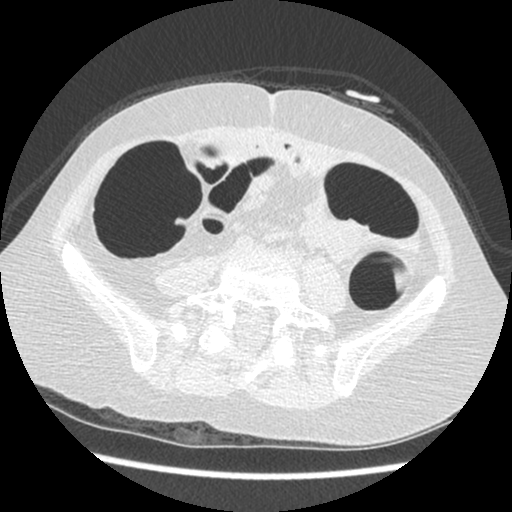
[im 222/355  soft-tissue]
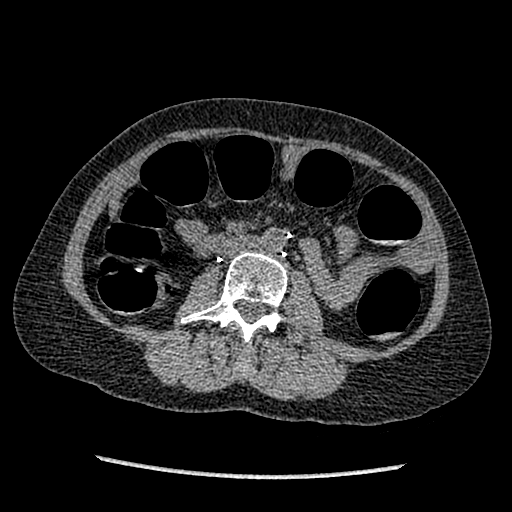
[im 222/355  lung]
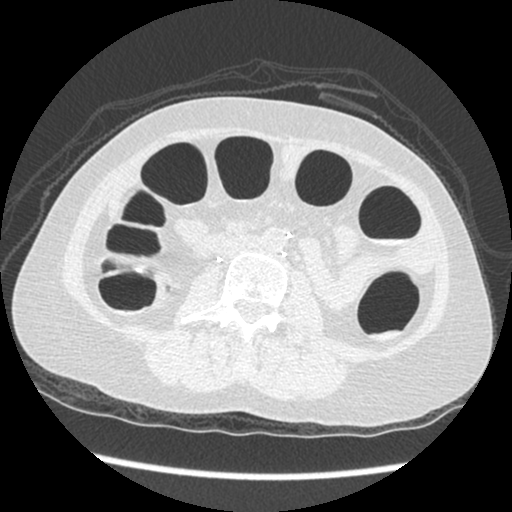
[im 266/355  soft-tissue]
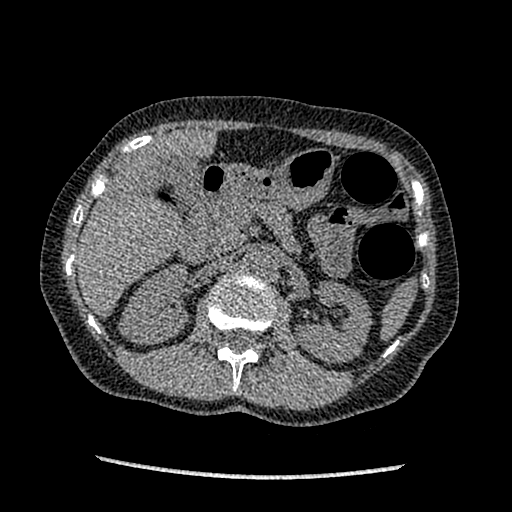
[im 266/355  lung]
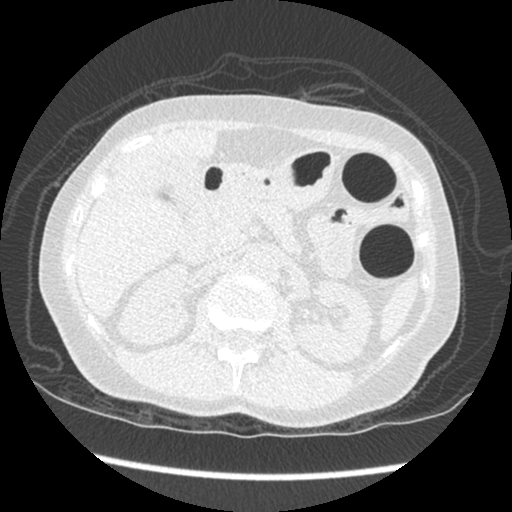
[im 310/355  soft-tissue]
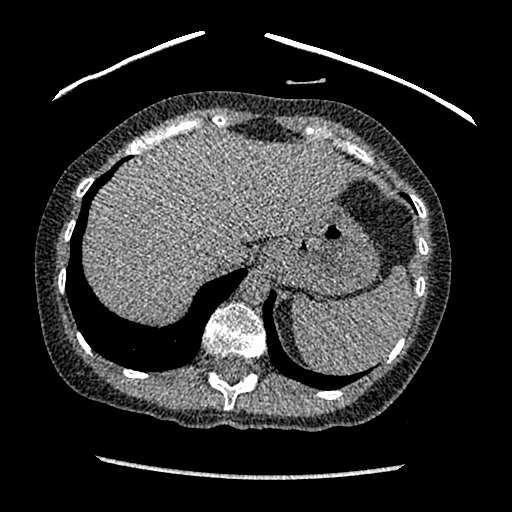
[im 310/355  lung]
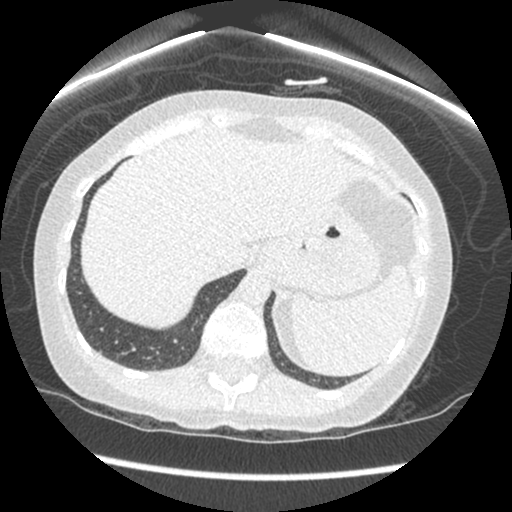

[Series 6: prone · axial · 0.62mm/px · z∈[-358,-190]mm · 4 of 358 slices shown]
[im 45/358  soft-tissue]
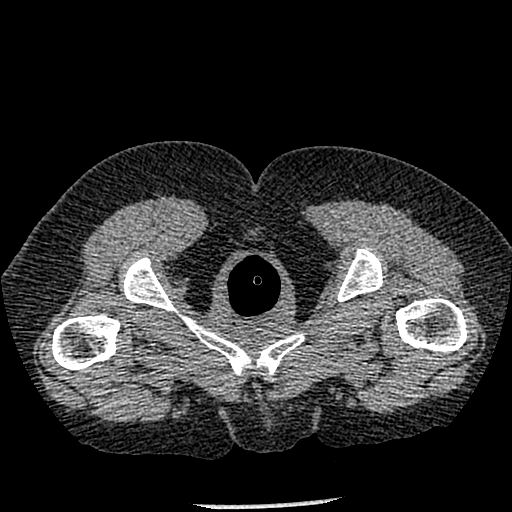
[im 90/358  soft-tissue]
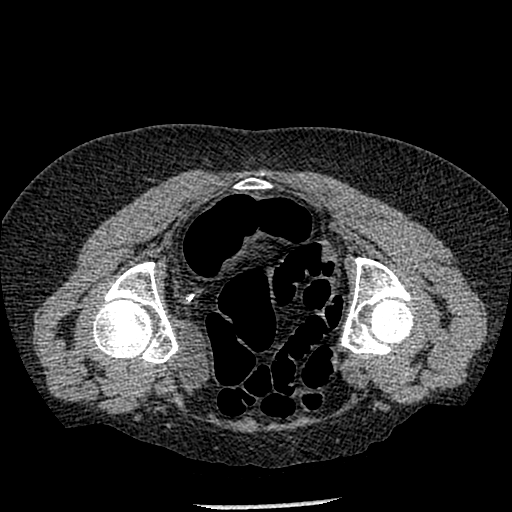
[im 134/358  soft-tissue]
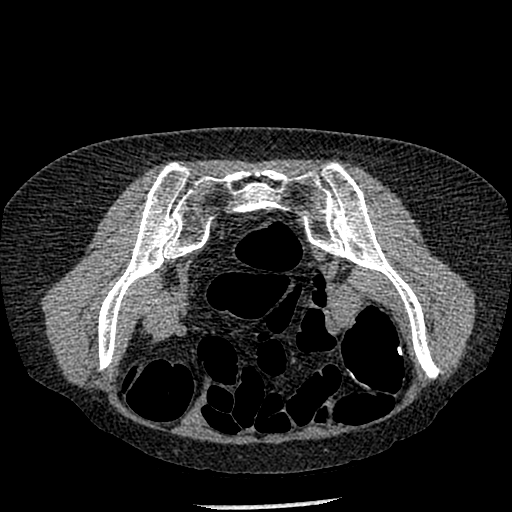
[im 179/358  soft-tissue]
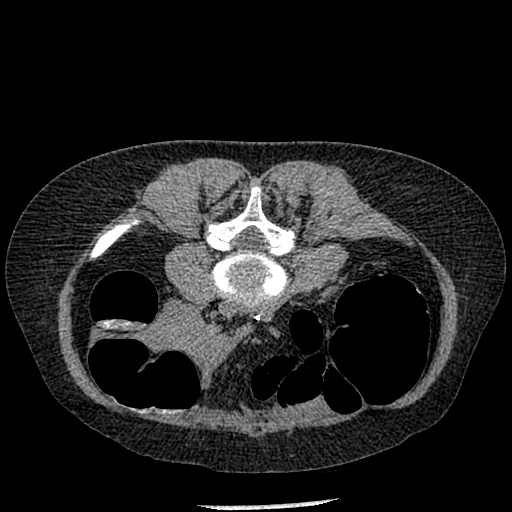

[Series 601: coronal body · coronal · 0.86mm/px · 1 of 100 slices shown, 2 images]
[im 34/100  soft-tissue]
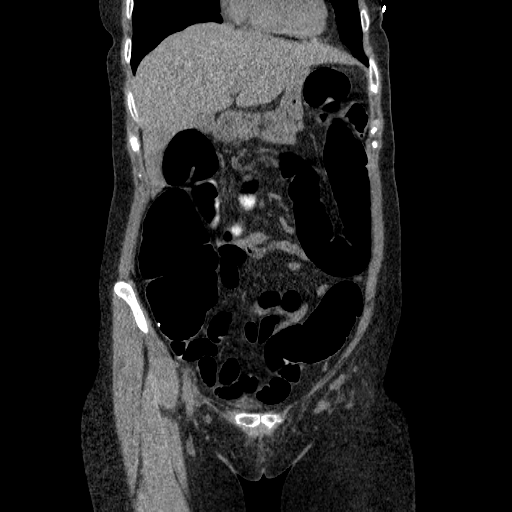
[im 34/100  bone]
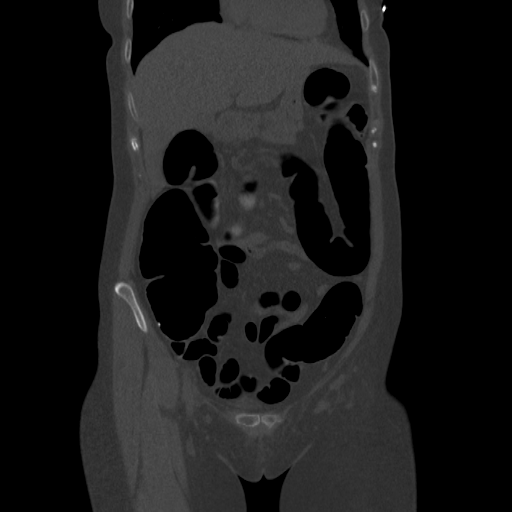

[12 of 36 positions shown; findings below may reference images not displayed]

FINDINGS: Only a small amount of retained fluid and feces is noted
which is not well tagged.  The colon is noted to be very redundant.
On the prone images there is soft tissue lesion in the ascending
colon ,most typical of pedunculated polyp at approximately 170 cm
from the anal verge.  This apparent pedunculated lesion measures 17
x 7 mm.  This lesion however is not well seen on the supine images
and may be lying flat against the mucosa.  No other polypoid lesion
is seen.  Since this apparent pedunculated lesion measures 17 mm in
greatest length, this study is considered C-Rads category C3, i.e.,
polyp, possibly advanced adenoma, follow-up colonoscopy
recommended.
IMPRESSION: Apparent pedunculated polypoid lesion in the right colon as
described above.  Therefore this study is considered C-Rads
category C3, possibly advanced adenoma, follow-up colonoscopy is
recommended.

Virtual colonoscopy is not designed to detect diminutive polyps
(i.e., less than or equal to 5 mm), the presence or absence of
which may not affect clinical management

CT ABDOMEN AND PELVIS WITHOUT CONTRAST

CT ABDOMEN
FINDINGS: The lung bases are clear.  There is a small hiatal hernia
present.  The liver is normal in the unenhanced state.  No
calcified gallstones are seen.  The pancreas, adrenal glands, and
spleen appear grossly normal.  No renal calculi are noted.  The
abdominal aorta is normal in caliber.  Surgical clips are noted in
the retroperitoneum and appear stable compared to the prior CT.  No
adenopathy is seen.
IMPRESSION: Stable unenhanced CT of the abdomen.  Surgical clips are noted in
the retroperitoneum.

CT PELVIS
FINDINGS: The urinary bladder is not distended and cannot be
evaluated.  An oval low attenuation structure in the left pelvic
sidewall is relatively unchanged and most like represents
lymphocele from prior surgery.  No adenopathy is seen within the
pelvis and no free fluid is noted.  There is anterolisthesis of L5
on S1 by 9 mm with bilateral pars defects present at L5.  There is
degenerative disc disease at the L5 S1 level.
IMPRESSION: 1.  Stable low attenuation oval structure in the left pelvic
sidewall most consistent with lymphocele postoperatively.
 2.  No adenopathy.
 3.  9 mm anterolisthesis of L5 and S1 with pars defects and
degenerative disc disease.

## 2017-09-24 DIAGNOSIS — I10 Essential (primary) hypertension: Secondary | ICD-10-CM | POA: Diagnosis not present

## 2017-09-24 DIAGNOSIS — R413 Other amnesia: Secondary | ICD-10-CM | POA: Diagnosis not present

## 2017-09-24 DIAGNOSIS — Z681 Body mass index (BMI) 19 or less, adult: Secondary | ICD-10-CM | POA: Diagnosis not present

## 2017-11-05 DIAGNOSIS — Z681 Body mass index (BMI) 19 or less, adult: Secondary | ICD-10-CM | POA: Diagnosis not present

## 2017-11-05 DIAGNOSIS — R413 Other amnesia: Secondary | ICD-10-CM | POA: Diagnosis not present

## 2017-11-05 DIAGNOSIS — I10 Essential (primary) hypertension: Secondary | ICD-10-CM | POA: Diagnosis not present

## 2017-11-13 DIAGNOSIS — R413 Other amnesia: Secondary | ICD-10-CM | POA: Diagnosis not present

## 2018-01-23 ENCOUNTER — Encounter: Payer: Self-pay | Admitting: Neurology

## 2018-01-23 ENCOUNTER — Ambulatory Visit (INDEPENDENT_AMBULATORY_CARE_PROVIDER_SITE_OTHER): Payer: PPO | Admitting: Neurology

## 2018-01-23 ENCOUNTER — Encounter (INDEPENDENT_AMBULATORY_CARE_PROVIDER_SITE_OTHER): Payer: Self-pay

## 2018-01-23 ENCOUNTER — Other Ambulatory Visit: Payer: Self-pay

## 2018-01-23 VITALS — BP 178/70 | HR 89 | Ht 64.0 in | Wt 98.0 lb

## 2018-01-23 DIAGNOSIS — E538 Deficiency of other specified B group vitamins: Secondary | ICD-10-CM | POA: Diagnosis not present

## 2018-01-23 DIAGNOSIS — R413 Other amnesia: Secondary | ICD-10-CM | POA: Diagnosis not present

## 2018-01-23 DIAGNOSIS — R5382 Chronic fatigue, unspecified: Secondary | ICD-10-CM

## 2018-01-23 HISTORY — DX: Other amnesia: R41.3

## 2018-01-23 MED ORDER — MEMANTINE HCL 5 MG PO TABS: ORAL_TABLET | ORAL | 0 refills | Status: DC

## 2018-01-23 NOTE — Progress Notes (Signed)
Reason for visit: Memory disorder  Referring physician: Dr. Tomasa Hosteller is a 67 y.o. female  History of present illness:  Whitney Thomas is a 67 year old white female with a history of a change in her cognitive status over the last 18 months.  The patient has had some change in her ability to function.  She is leaving car doors open, leaving the lights on, she indicates that her general memory otherwise is fairly good.  She denies any problems remembering names for people or remembering recent conversations.  She does have difficulty keeping up with appointments, she tries to write things down.  She does not take any medications currently.  She reports that she has difficulty getting motivated to do things such as paying the bills, but when she does this she is able to perform this task well.  The patient no longer cooks much, she does not seem to be motivated to do this.  She indicates that she sleeps well at night, she has a good energy level during the day.  She reports no numbness or weakness of the face, arms, or legs.  She denies any balance problems or difficulty controlling the bowels or the bladder.  She indicates that her mother also had a memory problem as she got older.  She denies that she is having any problems with anxiety or depression.  The family is concerned about her ability to function.  She operates a motor vehicle without difficulty, there are no safety issues or problems with directions.  She comes to this office for an evaluation.  A CT scan of the brain was done at [facility] and showed mild small vessel disease and atrophy.  The patient has a prior history of ovarian cancer, she has lost about 12 pounds in the last year, her appetite has significantly declined.  Past Medical History:  Diagnosis Date  . Hypertension   . Irritable bowel syndrome   . Memory difficulty 01/23/2018  . Ovarian cancer [facility])     Past Surgical History:  Procedure Laterality  Date  . VAGINAL HYSTERECTOMY  2012   Dr. Aldean Ast    Family History  Problem Relation Age of Onset  . Dementia Mother   . Stroke Father     Social history:  reports that she has never smoked. She has never used smokeless tobacco. She reports that she does not drink alcohol or use drugs.  Medications:  Prior to Admission medications   Medication Sig Start Date End Date Taking? Authorizing Provider  memantine (NAMENDA) 5 MG tablet Take 1 tablet daily for one week, then take 1 tablet twice daily for one week, then take 1 tablet in the morning and 2 in the evening for one week, then take 2 tablets twice daily 01/23/18   Whitney Ducking, MD     No Known Allergies  ROS:  Out of a complete 14 system review of symptoms, the patient complains only of the following symptoms, and all other reviewed systems are negative.  Memory loss  Blood pressure (!) 178/70, pulse 89, height 5\' 4"  (1.626 m), weight 98 lb (44.5 kg), SpO2 98 %.  Physical Exam  General: The patient is alert and cooperative at the time of the examination.  Eyes: Pupils are equal, round, and reactive to light. Discs are flat bilaterally.  Neck: The neck is supple, no carotid bruits are noted.  Respiratory: The respiratory examination is clear.  Cardiovascular: The cardiovascular examination reveals a regular  rate and rhythm, no obvious murmurs or rubs are noted.  Skin: Extremities are without significant edema.  Neurologic Exam  Mental status: The patient is alert and oriented x 3 at the time of the examination. The Mini-Mental status examination done today shows a total score of 26/30.  Cranial nerves: Facial symmetry is present. There is good sensation of the face to pinprick and soft touch bilaterally. The strength of the facial muscles and the muscles to head turning and shoulder shrug are normal bilaterally. Speech is well enunciated, no aphasia or dysarthria is noted. Extraocular movements are full.  Visual fields are full. The tongue is midline, and the patient has symmetric elevation of the soft palate. No obvious hearing deficits are noted.  Motor: The motor testing reveals 5 over 5 strength of all 4 extremities. Good symmetric motor tone is noted throughout.  Sensory: Sensory testing is intact to pinprick, soft touch, vibration sensation, and position sense on all 4 extremities. No evidence of extinction is noted.  Coordination: Cerebellar testing reveals good finger-nose-finger and heel-to-shin bilaterally.  Gait and station: Gait is normal. Tandem gait is normal. Romberg is negative. No drift is seen.  Reflexes: Deep tendon reflexes are symmetric and normal bilaterally. Toes are downgoing bilaterally.   Assessment/Plan:  1.  Memory disorder  The patient has had some decline in functional status, she appears to have had a change in personality, she is no longer motivated to get things done during the day.  The patient could potentially have a frontotemporal dementia.  The patient will be started on Namenda, she will have blood work done today.  She will follow-up in 6 months.  She is reporting ongoing weight loss, this may require further medical evaluation.  She does have a history of ovarian cancer.  Jill Alexanders MD 01/23/2018 9:51 AM  [facility] [address] [place], [place] [address]  Phone [phone] Fax [phone]

## 2018-01-23 NOTE — Progress Notes (Signed)
Faxed printed/signed rx memantine 5mg  tab to [facility] at [phone]. Received fax confirmation.

## 2018-01-24 ENCOUNTER — Telehealth: Payer: Self-pay | Admitting: *Deleted

## 2018-01-24 LAB — VITAMIN B12: VITAMIN B 12: 332 pg/mL (ref 232–1245)

## 2018-01-24 LAB — HIV ANTIBODY (ROUTINE TESTING W REFLEX): HIV SCREEN 4TH GENERATION: NONREACTIVE

## 2018-01-24 LAB — TSH: TSH: 1.11 u[IU]/mL (ref 0.450–4.500)

## 2018-01-24 LAB — RPR: RPR: NONREACTIVE

## 2018-01-24 LAB — SEDIMENTATION RATE: SED RATE: 6 mm/h (ref 0–40)

## 2018-01-24 NOTE — Telephone Encounter (Signed)
-----   Message from Kathrynn Ducking, MD sent at 01/24/2018  7:08 AM EDT -----   The blood work results are unremarkable. Please call the patient.  ----- Message ----- From: Lavone Neri Lab Results In Sent: 01/24/2018   5:40 AM To: Kathrynn Ducking, MD

## 2018-01-24 NOTE — Telephone Encounter (Signed)
Called, LVM for pt about unremarkable labs per CW,MD note. (ok per DPR). Advised she did not have to return my call unless she had further questions.

## 2018-03-06 ENCOUNTER — Other Ambulatory Visit: Payer: Self-pay | Admitting: Neurology

## 2018-03-07 NOTE — Telephone Encounter (Signed)
I called pt. She is taking memantine 5mg  2 tablets BID. It was refilled as such today. Pt thanked me for checking on her.

## 2018-03-19 DIAGNOSIS — L301 Dyshidrosis [pompholyx]: Secondary | ICD-10-CM | POA: Diagnosis not present

## 2018-03-19 DIAGNOSIS — L299 Pruritus, unspecified: Secondary | ICD-10-CM | POA: Diagnosis not present

## 2018-07-31 NOTE — Progress Notes (Deleted)
[facility]  PATIENT: Whitney Thomas DOB: 1951/02/08   REASON FOR VISIT: Follow-up for memory loss HISTORY FROM:    HISTORY OF PRESENT ILLNESS: 01/23/18 KWMs. Territo is a 67 year old white female with a history of a change in her cognitive status over the last 18 months.  The patient has had some change in her ability to function.  She is leaving car doors open, leaving the lights on, she indicates that her general memory otherwise is fairly good.  She denies any problems remembering names for people or remembering recent conversations.  She does have difficulty keeping up with appointments, she tries to write things down.  She does not take any medications currently.  She reports that she has difficulty getting motivated to do things such as paying the bills, but when she does this she is able to perform this task well.  The patient no longer cooks much, she does not seem to be motivated to do this.  She indicates that she sleeps well at night, she has a good energy level during the day.  She reports no numbness or weakness of the face, arms, or legs.  She denies any balance problems or difficulty controlling the bowels or the bladder.  She indicates that her mother also had a memory problem as she got older.  She denies that she is having any problems with anxiety or depression.  The family is concerned about her ability to function.  She operates a motor vehicle without difficulty, there are no safety issues or problems with directions.  She comes to this office for an evaluation.  A CT scan of the brain was done at [facility] and showed mild small vessel disease and atrophy.  The patient has a prior history of ovarian cancer, she has lost about 12 pounds in the last year, her appetite has significantly declined.   REVIEW OF SYSTEMS: Full 14 system review of systems performed and notable only for those listed, all others are neg:  Constitutional: neg  Cardiovascular:  neg Ear/Nose/Throat: neg  Skin: neg Eyes: neg Respiratory: neg Gastroitestinal: neg  Hematology/Lymphatic: neg  Endocrine: neg Musculoskeletal:neg Allergy/Immunology: neg Neurological: neg Psychiatric: neg Sleep : neg   ALLERGIES: No Known Allergies  HOME MEDICATIONS: Outpatient Medications Prior to Visit  Medication Sig Dispense Refill  . memantine (NAMENDA) 5 MG tablet Take 2 tablets (10 mg total) by mouth 2 (two) times daily. 120 tablet 5   No facility-administered medications prior to visit.     PAST MEDICAL HISTORY: Past Medical History:  Diagnosis Date  . Hypertension   . Irritable bowel syndrome   . Memory difficulty 01/23/2018  . Ovarian cancer ([place])     PAST SURGICAL HISTORY: Past Surgical History:  Procedure Laterality Date  . VAGINAL HYSTERECTOMY  2012   Dr. Aldean Ast    FAMILY HISTORY: Family History  Problem Relation Age of Onset  . Dementia Mother   . Stroke Father     SOCIAL HISTORY: Social History   Socioeconomic History  . Marital status: Married    Spouse name: Not on file  . Number of children: 1  . Years of education: 38  . Highest education level: Not on file  Occupational History  . Occupation: Retired  Scientific laboratory technician  . Financial resource strain: Not on file  . Food insecurity:    Worry: Not on file    Inability: Not on file  . Transportation needs:    Medical: Not on file  Non-medical: Not on file  Tobacco Use  . Smoking status: Never Smoker  . Smokeless tobacco: Never Used  Substance and Sexual Activity  . Alcohol use: Never    Frequency: Never  . Drug use: Never  . Sexual activity: Not on file  Lifestyle  . Physical activity:    Days per week: Not on file    Minutes per session: Not on file  . Stress: Not on file  Relationships  . Social connections:    Talks on phone: Not on file    Gets together: Not on file    Attends religious service: Not on file    Active member of club or organization: Not on  file    Attends meetings of clubs or organizations: Not on file    Relationship status: Not on file  . Intimate partner violence:    Fear of current or ex partner: Not on file    Emotionally abused: Not on file    Physically abused: Not on file    Forced sexual activity: Not on file  Other Topics Concern  . Not on file  Social History Narrative   Lives w/ husband   Caffeine use: Soda daily   Right handed      PHYSICAL EXAM  There were no vitals filed for this visit. There is no height or weight on file to calculate BMI.  Generalized: Well developed, in no acute distress  Head: normocephalic and atraumatic,. Oropharynx benign  Neck: Supple, no carotid bruits  Cardiac: Regular rate rhythm, no murmur  Musculoskeletal: No deformity   Neurological examination   Mentation: Alert oriented to time, place, history taking. Attention span and concentration appropriate. Recent and remote memory intact.  Follows all commands speech and language fluent.   Cranial nerve II-XII: Fundoscopic exam reveals sharp disc margins.Pupils were equal round reactive to light extraocular movements were full, visual field were full on confrontational test. Facial sensation and strength were normal. hearing was intact to finger rubbing bilaterally. Uvula tongue midline. head turning and shoulder shrug were normal and symmetric.Tongue protrusion into cheek strength was normal. Motor: normal bulk and tone, full strength in the BUE, BLE, fine finger movements normal, no pronator drift. No focal weakness Sensory: normal and symmetric to light touch, pinprick, and  Vibration, proprioception  Coordination: finger-nose-finger, heel-to-shin bilaterally, no dysmetria Reflexes: Brachioradialis 2/2, biceps 2/2, triceps 2/2, patellar 2/2, Achilles 2/2, plantar responses were flexor bilaterally. Gait and Station: Rising up from seated position without assistance, normal stance,  moderate stride, good arm swing, smooth  turning, able to perform tiptoe, and heel walking without difficulty. Tandem gait is steady  DIAGNOSTIC DATA (LABS, IMAGING, TESTING) - I reviewed patient records, labs, notes, testing and imaging myself where available.  No results found for: WBC, HGB, HCT, MCV, PLT No results found for: NA, K, CL, CO2, GLUCOSE, BUN, CREATININE, CALCIUM, PROT, ALBUMIN, AST, ALT, ALKPHOS, BILITOT, GFRNONAA, GFRAA No results found for: CHOL, HDL, LDLCALC, LDLDIRECT, TRIG, CHOLHDL No results found for: HGBA1C Lab Results  Component Value Date   VITAMINB12 332 01/23/2018   Lab Results  Component Value Date   TSH 1.110 01/23/2018    ***  ASSESSMENT AND PLAN  67 y.o. year old female  has a past medical history of Hypertension, Irritable bowel syndrome, Memory difficulty (01/23/2018), and Ovarian cancer ([place]). here with ***  Memory disorder  The patient has had some decline in functional status, she appears to have had a change in personality, she is no longer  motivated to get things done during the day.  The patient could potentially have a frontotemporal dementia.  The patient will be started on Namenda, she will have blood work done today.  She will follow-up in 6 months.  She is reporting ongoing weight loss, this may require further medical evaluation.  She does have a history of ovarian cancer.  Dennie Bible, [facility], [facility], APRN  [facility] [address], [place], [place] [address] [phone]

## 2018-08-01 ENCOUNTER — Ambulatory Visit: Payer: Self-pay | Admitting: Nurse Practitioner

## 2018-08-01 ENCOUNTER — Telehealth: Payer: Self-pay | Admitting: *Deleted

## 2018-08-01 NOTE — Telephone Encounter (Signed)
Patient was no show for follow up with NP today.  

## 2018-08-13 ENCOUNTER — Encounter: Payer: Self-pay | Admitting: Nurse Practitioner

## 2018-08-19 ENCOUNTER — Encounter: Payer: Self-pay | Admitting: Adult Health

## 2018-08-19 ENCOUNTER — Ambulatory Visit: Payer: PPO | Admitting: Adult Health

## 2018-08-19 VITALS — BP 161/64 | HR 96 | Ht 64.0 in | Wt 94.6 lb

## 2018-08-19 DIAGNOSIS — R413 Other amnesia: Secondary | ICD-10-CM | POA: Diagnosis not present

## 2018-08-19 NOTE — Patient Instructions (Signed)
Your Plan:  Continue Namenda Follow-up with PCP for weight loss If your symptoms worsen or you develop new symptoms please let us know.   Thank you for coming to see [place] at [facility]. I hope we have been able to provide you high quality care today.  You may receive a patient satisfaction survey over the next few weeks. We would appreciate your feedback and comments so that we may continue to improve ourselves and the health of our patients.

## 2018-08-19 NOTE — Progress Notes (Signed)
PATIENT: Whitney Thomas DOB: 11-06-1950  REASON FOR VISIT: follow up HISTORY FROM: patient  HISTORY OF PRESENT ILLNESS: Today 08/19/18:  Whitney Thomas is a 67 year old female with a history of memory disturbance.  She returns today for follow-up.  She is currently on Namenda 10 mg twice a day.  Her husband feels that her memory has gotten worse.  She is able to complete all ADLs independently.  She does not operate a motor vehicle.  She manages her medications and appointments.  Denies any trouble sleeping.  No change in mood or behavior.  He reports that she is easy to anger.  He has noticed changes in her personality.  Denies any impulsive behavior or inappropriate behavior in public.  Her husband now manages all the finances.  No change in her appetite-has reports that she has lost a significant amount of weight.  Her weight has remained stable since the last visit.  She returns today for evaluation.  HISTORY Whitney Thomas is a 67 year old white female with a history of a change in her cognitive status over the last 18 months.  The patient has had some change in her ability to function.  She is leaving car doors open, leaving the lights on, she indicates that her general memory otherwise is fairly good.  She denies any problems remembering names for people or remembering recent conversations.  She does have difficulty keeping up with appointments, she tries to write things down.  She does not take any medications currently.  She reports that she has difficulty getting motivated to do things such as paying the bills, but when she does this she is able to perform this task well.  The patient no longer cooks much, she does not seem to be motivated to do this.  She indicates that she sleeps well at night, she has a good energy level during the day.  She reports no numbness or weakness of the face, arms, or legs.  She denies any balance problems or difficulty controlling the bowels or the bladder.  She  indicates that her mother also had a memory problem as she got older.  She denies that she is having any problems with anxiety or depression.  The family is concerned about her ability to function.  She operates a motor vehicle without difficulty, there are no safety issues or problems with directions.  She comes to this office for an evaluation.  A CT scan of the brain was done at [facility] and showed mild small vessel disease and atrophy.  The patient has a prior history of ovarian cancer, she has lost about 12 pounds in the last year, her appetite has significantly declined.    REVIEW OF SYSTEMS: Out of a complete 14 system review of symptoms, the patient complains only of the following symptoms, and all other reviewed systems are negative.  See HPI ALLERGIES: No Known Allergies  HOME MEDICATIONS: Outpatient Medications Prior to Visit  Medication Sig Dispense Refill  . memantine (NAMENDA) 5 MG tablet Take 2 tablets (10 mg total) by mouth 2 (two) times daily. 120 tablet 5   No facility-administered medications prior to visit.     PAST MEDICAL HISTORY: Past Medical History:  Diagnosis Date  . Hypertension   . Irritable bowel syndrome   . Memory difficulty 01/23/2018  . Ovarian cancer ([place])     PAST SURGICAL HISTORY: Past Surgical History:  Procedure Laterality Date  . VAGINAL HYSTERECTOMY  2012   Dr. Aldean Ast  FAMILY HISTORY: Family History  Problem Relation Age of Onset  . Dementia Mother   . Stroke Father     SOCIAL HISTORY: Social History   Socioeconomic History  . Marital status: Married    Spouse name: Not on file  . Number of children: 1  . Years of education: 15  . Highest education level: Not on file  Occupational History  . Occupation: Retired  Scientific laboratory technician  . Financial resource strain: Not on file  . Food insecurity:    Worry: Not on file    Inability: Not on file  . Transportation needs:    Medical: Not on file    Non-medical: Not  on file  Tobacco Use  . Smoking status: Never Smoker  . Smokeless tobacco: Never Used  Substance and Sexual Activity  . Alcohol use: Never    Frequency: Never  . Drug use: Never  . Sexual activity: Not on file  Lifestyle  . Physical activity:    Days per week: Not on file    Minutes per session: Not on file  . Stress: Not on file  Relationships  . Social connections:    Talks on phone: Not on file    Gets together: Not on file    Attends religious service: Not on file    Active member of club or organization: Not on file    Attends meetings of clubs or organizations: Not on file    Relationship status: Not on file  . Intimate partner violence:    Fear of current or ex partner: Not on file    Emotionally abused: Not on file    Physically abused: Not on file    Forced sexual activity: Not on file  Other Topics Concern  . Not on file  Social History Narrative   Lives w/ husband   Caffeine use: Soda daily   Right handed       PHYSICAL EXAM  Vitals:   08/19/18 1427  BP: (!) 161/64  Pulse: 96  Weight: 94 lb 9.6 oz (42.9 kg)  Height: 5\' 4"  (1.626 m)   Body mass index is 16.24 kg/m.   MMSE - Mini Mental State Exam 08/19/2018 01/23/2018  Orientation to time 3 5  Orientation to Place 5 5  Registration 3 3  Attention/ Calculation 0 2  Recall 3 3  Language- name 2 objects 2 2  Language- repeat 1 1  Language- follow 3 step command 3 3  Language- read & follow direction 1 1  Write a sentence 1 1  Copy design 0 0  Total score 22 26     Generalized: Well developed, in no acute distress   Neurological examination  Mentation: Alert oriented to time, place, history taking. Follows all commands speech and language fluent Cranial nerve II-XII: Pupils were equal round reactive to light. Extraocular movements were full, visual field were full on confrontational test. Facial sensation and strength were normal. Uvula tongue midline. Head turning and shoulder shrug  were  normal and symmetric. Motor: The motor testing reveals 5 over 5 strength of all 4 extremities. Good symmetric motor tone is noted throughout.  Sensory: Sensory testing is intact to soft touch on all 4 extremities. No evidence of extinction is noted.  Coordination: Cerebellar testing reveals good finger-nose-finger and heel-to-shin bilaterally.  Gait and station: Gait is normal. Tandem gait is normal. Romberg is negative. No drift is seen.  Reflexes: Deep tendon reflexes are symmetric and normal bilaterally.   DIAGNOSTIC DATA (  LABS, IMAGING, TESTING) - I reviewed patient records, labs, notes, testing and imaging myself where available.   Lab Results  Component Value Date   [id number] 470 01/23/2018   Lab Results  Component Value Date   TSH 1.110 01/23/2018      ASSESSMENT AND PLAN 67 y.o. year old female  has a past medical history of Hypertension, Irritable bowel syndrome, Memory difficulty (01/23/2018), and Ovarian cancer ([place]). here with :  1.  Memory disturbance  The patient's memory score has declined slightly since last visit.  She will continue on Namenda.  We will not consider Aricept at this time due to weight loss.  The patient was given an information packet about dementia.  Advised that this could be an Alzheimer's process versus frontotemporal dementia?  We will continue to monitor symptoms over time.  Advised that if her symptoms worsen or she develops new symptoms she should let us know.  She will follow-up in 6 months or sooner if needed.  I spent 25 minutes with the patient. 50% of this time was spent discussing diagnosis   Ward Givens, MSN, NP-C 08/19/2018, 3:08 PM [facility] [address] [place], [place] [address] [phone]

## 2018-08-20 NOTE — Progress Notes (Signed)
I have read the note, and I agree with the clinical assessment and plan.  Charles K Willis   

## 2018-09-28 ENCOUNTER — Other Ambulatory Visit: Payer: Self-pay | Admitting: Neurology

## 2019-03-26 ENCOUNTER — Encounter: Payer: Self-pay | Admitting: Adult Health

## 2019-03-26 ENCOUNTER — Ambulatory Visit (INDEPENDENT_AMBULATORY_CARE_PROVIDER_SITE_OTHER): Payer: PPO | Admitting: Adult Health

## 2019-03-26 ENCOUNTER — Other Ambulatory Visit: Payer: Self-pay

## 2019-03-26 VITALS — BP 176/7 | HR 104 | Temp 98.0°F | Ht 64.0 in | Wt 92.8 lb

## 2019-03-26 DIAGNOSIS — R413 Other amnesia: Secondary | ICD-10-CM

## 2019-03-26 NOTE — Progress Notes (Signed)
PATIENT: Whitney Thomas DOB: 1951-03-22  REASON FOR VISIT: follow up HISTORY FROM: patient  HISTORY OF PRESENT ILLNESS: Today 03/26/19:  Whitney Thomas is a 68 year old female with a history of memory disturbance.  She returns today for follow-up.  She is currently on Namenda 10 mg twice a day.  Her and her husband both feel that her memory is gotten worse.  She is able to complete ADLs independently but requires prompting.  She does not operate a motor vehicle.  She no longer cooks.  Denies any trouble sleeping.  Her husband reports that she is more irritable and short tempered.  He has also noticed that she has a hard time following directions.  She returns today for follow-up.  HISTORY12/09/19:  Whitney Thomas is a 68 year old female with a history of memory disturbance.  She returns today for follow-up.  She is currently on Namenda 10 mg twice a day.  Her husband feels that her memory has gotten worse.  She is able to complete all ADLs independently.  She does not operate a motor vehicle.  She manages her medications and appointments.  Denies any trouble sleeping.  No change in mood or behavior.  He reports that she is easy to anger.  He has noticed changes in her personality.  Denies any impulsive behavior or inappropriate behavior in public.  Her husband now manages all the finances.  No change in her appetite-has reports that she has lost a significant amount of weight.  Her weight has remained stable since the last visit.  She returns today for evaluation.  REVIEW OF SYSTEMS: Out of a complete 14 system review of symptoms, the patient complains only of the following symptoms, and all other reviewed systems are negative.  See HPI  ALLERGIES: No Known Allergies  HOME MEDICATIONS: Outpatient Medications Prior to Visit  Medication Sig Dispense Refill  . memantine (NAMENDA) 5 MG tablet Take 2 tablets (10 mg total) by mouth 2 (two) times daily. 120 tablet 5   No facility-administered  medications prior to visit.     PAST MEDICAL HISTORY: Past Medical History:  Diagnosis Date  . Hypertension   . Irritable bowel syndrome   . Memory difficulty 01/23/2018  . Ovarian cancer ([place])     PAST SURGICAL HISTORY: Past Surgical History:  Procedure Laterality Date  . VAGINAL HYSTERECTOMY  2012   Dr. Aldean Ast    FAMILY HISTORY: Family History  Problem Relation Age of Onset  . Dementia Mother   . Stroke Father     SOCIAL HISTORY: Social History   Socioeconomic History  . Marital status: Married    Spouse name: Not on file  . Number of children: 1  . Years of education: 53  . Highest education level: Not on file  Occupational History  . Occupation: Retired  Scientific laboratory technician  . Financial resource strain: Not on file  . Food insecurity    Worry: Not on file    Inability: Not on file  . Transportation needs    Medical: Not on file    Non-medical: Not on file  Tobacco Use  . Smoking status: Never Smoker  . Smokeless tobacco: Never Used  Substance and Sexual Activity  . Alcohol use: Never    Frequency: Never  . Drug use: Never  . Sexual activity: Not on file  Lifestyle  . Physical activity    Days per week: Not on file    Minutes per session: Not on file  . Stress: Not  on file  Relationships  . Social Herbalist on phone: Not on file    Gets together: Not on file    Attends religious service: Not on file    Active member of club or organization: Not on file    Attends meetings of clubs or organizations: Not on file    Relationship status: Not on file  . Intimate partner violence    Fear of current or ex partner: Not on file    Emotionally abused: Not on file    Physically abused: Not on file    Forced sexual activity: Not on file  Other Topics Concern  . Not on file  Social History Narrative   Lives w/ husband   Caffeine use: Soda daily   Right handed       PHYSICAL EXAM  Vitals:   03/26/19 0759  BP: (!) 176/7  Pulse: (!)  104  Temp: 98 F (36.7 C)  TempSrc: Oral  Weight: 92 lb 12.8 oz (42.1 kg)  Height: 5\' 4"  (1.626 m)   Body mass index is 15.93 kg/m.   MMSE - Mini Mental State Exam 03/26/2019 08/19/2018 01/23/2018  Not completed: (No Data) - -  Orientation to time 0 3 5  Orientation to Place 2 5 5   Registration 3 3 3   Attention/ Calculation 0 0 2  Recall 1 3 3   Language- name 2 objects 2 2 2   Language- repeat 1 1 1   Language- follow 3 step command 0 3 3  Language- read & follow direction 1 1 1   Write a sentence 0 1 1  Copy design 0 0 0  Total score 10 22 26      Generalized: Well developed, in no acute distress   Neurological examination  Mentation: Alert .Follows all commands intermittently. Cranial nerve II-XII: Extraocular movements were full, visual field were full on confrontational test. Facial sensation and strength were normal. Uvula tongue midline. Head turning and shoulder shrug  were normal and symmetric. Motor: The motor testing reveals 5 over 5 strength of all 4 extremities. Good symmetric motor tone is noted throughout.  Sensory: Sensory testing is intact to soft touch on all 4 extremities. No evidence of extinction is noted.  Coordination: Cerebellar testing reveals good finger-nose-finger and heel-to-shin bilaterally.  Gait and station: Gait is normal.  Reflexes: Deep tendon reflexes are symmetric and normal bilaterally.   DIAGNOSTIC DATA (LABS, IMAGING, TESTING) - I reviewed patient records, labs, notes, testing and imaging myself where available.   Lab Results  Component Value Date   [id number] 621 01/23/2018   Lab Results  Component Value Date   TSH 1.110 01/23/2018      ASSESSMENT AND PLAN 68 y.o. year old female  has a past medical history of Hypertension, Irritable bowel syndrome, Memory difficulty (01/23/2018), and Ovarian cancer ([place]). here with:  1.  Memory disturbance  The patient's memory score has decreased significantly since the last visit.  Her  husband feels that this is more of a gradual change rather than a sudden change.  Nevertheless I will check blood work and a urinalysis to ensure that there is no other causes for change in memory score.  The patient will remain on Namenda 10 mg twice a day.  I have advised if her symptoms worsen or she develop new symptoms she should let us know.  She will follow-up in 6 months or sooner if needed   I spent 15 minutes with the patient. 50% of this  time was spent reviewing her memory score and medication management   Ward Givens, MSN, NP-C 03/26/2019, 8:25 AM [facility] [address], [place], [place] [address] [phone]

## 2019-03-26 NOTE — Progress Notes (Signed)
I have read the note, and I agree with the clinical assessment and plan.  Sanjna Haskew K Katheen Aslin   

## 2019-03-26 NOTE — Patient Instructions (Signed)
Your Plan:  Continue Namenda Blood work today If your symptoms worsen or you develop new symptoms please let us know.   Thank you for coming to see [place] at [facility]. I hope we have been able to provide you high quality care today.  You may receive a patient satisfaction survey over the next few weeks. We would appreciate your feedback and comments so that we may continue to improve ourselves and the health of our patients.

## 2019-03-27 ENCOUNTER — Telehealth: Payer: Self-pay | Admitting: *Deleted

## 2019-03-27 LAB — COMPREHENSIVE METABOLIC PANEL
ALT: 20 IU/L (ref 0–32)
AST: 21 IU/L (ref 0–40)
Albumin/Globulin Ratio: 2.1 (ref 1.2–2.2)
Albumin: 4.5 g/dL (ref 3.8–4.8)
Alkaline Phosphatase: 96 IU/L (ref 39–117)
BUN/Creatinine Ratio: 19 (ref 12–28)
BUN: 12 mg/dL (ref 8–27)
Bilirubin Total: 0.4 mg/dL (ref 0.0–1.2)
CO2: 23 mmol/L (ref 20–29)
Calcium: 9.9 mg/dL (ref 8.7–10.3)
Chloride: 103 mmol/L (ref 96–106)
Creatinine, Ser: 0.63 mg/dL (ref 0.57–1.00)
GFR calc Af Amer: 108 mL/min/{1.73_m2} (ref >59)
GFR calc non Af Amer: 94 mL/min/{1.73_m2} (ref >59)
Globulin, Total: 2.1 g/dL (ref 1.5–4.5)
Glucose: 96 mg/dL (ref 65–99)
Potassium: 4 mmol/L (ref 3.5–5.2)
Sodium: 141 mmol/L (ref 134–144)
Total Protein: 6.6 g/dL (ref 6.0–8.5)

## 2019-03-27 LAB — CBC WITH DIFFERENTIAL/PLATELET
Basophils Absolute: 0 10*3/uL (ref 0.0–0.2)
Basos: 1 %
EOS (ABSOLUTE): 0.1 10*3/uL (ref 0.0–0.4)
Eos: 2 %
Hematocrit: 39.5 % (ref 34.0–46.6)
Hemoglobin: 13.3 g/dL (ref 11.1–15.9)
Immature Grans (Abs): 0 10*3/uL (ref 0.0–0.1)
Immature Granulocytes: 0 %
Lymphocytes Absolute: 1.4 10*3/uL (ref 0.7–3.1)
Lymphs: 24 %
MCH: 28.8 pg (ref 26.6–33.0)
MCHC: 33.7 g/dL (ref 31.5–35.7)
MCV: 86 fL (ref 79–97)
Monocytes Absolute: 0.3 10*3/uL (ref 0.1–0.9)
Monocytes: 6 %
Neutrophils Absolute: 4 10*3/uL (ref 1.4–7.0)
Neutrophils: 67 %
Platelets: 265 10*3/uL (ref 150–450)
RBC: 4.62 x10E6/uL (ref 3.77–5.28)
RDW: 12.2 % (ref 11.7–15.4)
WBC: 5.9 10*3/uL (ref 3.4–10.8)

## 2019-03-27 NOTE — Telephone Encounter (Signed)
LMVM for home that her lab tests normal.  Waiting on one that is pending.  She is to call back if questions.

## 2019-03-27 NOTE — Telephone Encounter (Signed)
-----   Message from Ward Givens, NP sent at 03/27/2019 10:27 AM EDT ----- Labs results are unremarkable. Please call patient with results.  Still waiting for urinalysis to result

## 2019-05-15 ENCOUNTER — Other Ambulatory Visit: Payer: Self-pay | Admitting: Adult Health

## 2019-07-02 DIAGNOSIS — Z79899 Other long term (current) drug therapy: Secondary | ICD-10-CM | POA: Diagnosis not present

## 2019-07-02 DIAGNOSIS — Z0000 Encounter for general adult medical examination without abnormal findings: Secondary | ICD-10-CM | POA: Diagnosis not present

## 2019-07-02 DIAGNOSIS — Z23 Encounter for immunization: Secondary | ICD-10-CM | POA: Diagnosis not present

## 2019-07-02 DIAGNOSIS — R41841 Cognitive communication deficit: Secondary | ICD-10-CM | POA: Diagnosis not present

## 2019-07-02 DIAGNOSIS — Z9181 History of falling: Secondary | ICD-10-CM | POA: Diagnosis not present

## 2019-07-02 DIAGNOSIS — Z131 Encounter for screening for diabetes mellitus: Secondary | ICD-10-CM | POA: Diagnosis not present

## 2019-07-02 DIAGNOSIS — Z13820 Encounter for screening for osteoporosis: Secondary | ICD-10-CM | POA: Diagnosis not present

## 2019-07-02 DIAGNOSIS — Z136 Encounter for screening for cardiovascular disorders: Secondary | ICD-10-CM | POA: Diagnosis not present

## 2019-07-02 DIAGNOSIS — I10 Essential (primary) hypertension: Secondary | ICD-10-CM | POA: Diagnosis not present

## 2019-07-02 DIAGNOSIS — Z780 Asymptomatic menopausal state: Secondary | ICD-10-CM | POA: Diagnosis not present

## 2019-07-02 DIAGNOSIS — Z1331 Encounter for screening for depression: Secondary | ICD-10-CM | POA: Diagnosis not present

## 2019-07-02 DIAGNOSIS — Z13220 Encounter for screening for lipoid disorders: Secondary | ICD-10-CM | POA: Diagnosis not present

## 2019-07-11 DIAGNOSIS — F0390 Unspecified dementia without behavioral disturbance: Secondary | ICD-10-CM | POA: Diagnosis not present

## 2019-07-11 DIAGNOSIS — R41841 Cognitive communication deficit: Secondary | ICD-10-CM | POA: Diagnosis not present

## 2019-07-11 DIAGNOSIS — R413 Other amnesia: Secondary | ICD-10-CM | POA: Diagnosis not present

## 2019-09-29 ENCOUNTER — Ambulatory Visit: Payer: PPO | Admitting: Adult Health

## 2019-09-29 ENCOUNTER — Encounter: Payer: Self-pay | Admitting: Adult Health

## 2019-09-29 ENCOUNTER — Other Ambulatory Visit: Payer: Self-pay

## 2019-09-29 VITALS — BP 150/72 | HR 85 | Temp 97.5°F | Ht 64.0 in | Wt 89.6 lb

## 2019-09-29 DIAGNOSIS — F0390 Unspecified dementia without behavioral disturbance: Secondary | ICD-10-CM | POA: Diagnosis not present

## 2019-09-29 NOTE — Progress Notes (Signed)
PATIENT: Whitney Thomas DOB: 07/13/1951  REASON FOR VISIT: follow up HISTORY FROM: patient  HISTORY OF PRESENT ILLNESS: Today 09/29/19:  Whitney Thomas is a 69 year old female with a history of memory disturbance.  She returns today with her husband.  She continues on Namenda 10 mg twice a day.  Her husband reports that her memory continues to decline.  He and the patient's sister is her primary caregivers.  She requires assistance with all ADLs.  She no longer operates a motor vehicle.  She does not manage any finances.  She no longer cooks.  Her husband manages her medications and appointments.  He denies any changes in her behavior.  She returns today for an evaluation.  HISTORY 03/26/19:  Whitney Thomas is a 70 year old female with a history of memory disturbance.  She returns today for follow-up.  She is currently on Namenda 10 mg twice a day.  Her and her husband both feel that her memory is gotten worse.  She is able to complete ADLs independently but requires prompting.  She does not operate a motor vehicle.  She no longer cooks.  Denies any trouble sleeping.  Her husband reports that she is more irritable and short tempered.  He has also noticed that she has a hard time following directions.  She returns today for follow-up.  REVIEW OF SYSTEMS: Out of a complete 14 system review of symptoms, the patient complains only of the following symptoms, and all other reviewed systems are negative.  See HPI  ALLERGIES: No Known Allergies  HOME MEDICATIONS: Outpatient Medications Prior to Visit  Medication Sig Dispense Refill  . memantine (NAMENDA) 5 MG tablet Take 2 tablets (10 mg total) by mouth 2 (two) times daily. 120 tablet 5   No facility-administered medications prior to visit.    PAST MEDICAL HISTORY: Past Medical History:  Diagnosis Date  . Hypertension   . Irritable bowel syndrome   . Memory difficulty 01/23/2018  . Ovarian cancer ([place])     PAST SURGICAL HISTORY: Past  Surgical History:  Procedure Laterality Date  . VAGINAL HYSTERECTOMY  2012   Dr. Aldean Ast    FAMILY HISTORY: Family History  Problem Relation Age of Onset  . Dementia Mother   . Stroke Father     SOCIAL HISTORY: Social History   Socioeconomic History  . Marital status: Married    Spouse name: Not on file  . Number of children: 1  . Years of education: 43  . Highest education level: Not on file  Occupational History  . Occupation: Retired  Tobacco Use  . Smoking status: Never Smoker  . Smokeless tobacco: Never Used  Substance and Sexual Activity  . Alcohol use: Never  . Drug use: Never  . Sexual activity: Not on file  Other Topics Concern  . Not on file  Social History Narrative   Lives w/ husband   Caffeine use: Soda daily   Right handed    Social Determinants of Health   Financial Resource Strain:   . Difficulty of Paying Living Expenses: Not on file  Food Insecurity:   . Worried About Charity fundraiser in the Last Year: Not on file  . Ran Out of Food in the Last Year: Not on file  Transportation Needs:   . Lack of Transportation (Medical): Not on file  . Lack of Transportation (Non-Medical): Not on file  Physical Activity:   . Days of Exercise per Week: Not on file  . Minutes of  Exercise per Session: Not on file  Stress:   . Feeling of Stress : Not on file  Social Connections:   . Frequency of Communication with Friends and Family: Not on file  . Frequency of Social Gatherings with Friends and Family: Not on file  . Attends Religious Services: Not on file  . Active Member of Clubs or Organizations: Not on file  . Attends Archivist Meetings: Not on file  . Marital Status: Not on file  Intimate Partner Violence:   . Fear of Current or Ex-Partner: Not on file  . Emotionally Abused: Not on file  . Physically Abused: Not on file  . Sexually Abused: Not on file      PHYSICAL EXAM  Vitals:   09/29/19 0745  BP: (!) 150/72  Pulse:  85  Temp: (!) 97.5 F (36.4 C)  TempSrc: Oral  Weight: 89 lb 9.6 oz (40.6 kg)  Height: 5\' 4"  (1.626 m)   Body mass index is 15.38 kg/m.   MMSE - Mini Mental State Exam 09/29/2019 03/26/2019 08/19/2018  Not completed: Unable to complete (No Data) -  Orientation to time - 0 3  Orientation to Place - 2 5  Registration - 3 3  Attention/ Calculation - 0 0  Recall - 1 3  Language- name 2 objects - 2 2  Language- repeat - 1 1  Language- follow 3 step command - 0 3  Language- read & follow direction - 1 1  Write a sentence - 0 1  Copy design - 0 0  Total score - 10 22     Generalized: Well developed, in no acute distress   Neurological examination  Mentation: Alert oriented to time, place, history taking. Follows all commands speech and language fluent Cranial nerve II-XII: Pupils were equal round reactive to light. Extraocular movements were full, visual field were full on confrontational test.  Head turning and shoulder shrug  were normal and symmetric. Motor: The motor testing reveals 5 over 5 strength of all 4 extremities. Good symmetric motor tone is noted throughout.  Sensory: Sensory testing is intact to soft touch on all 4 extremities. No evidence of extinction is noted.  Coordination: Cerebellar testing reveals good finger-nose-finger and heel-to-shin bilaterally.  Gait and station: Gait is normal.  Reflexes: Deep tendon reflexes are symmetric and normal bilaterally.   DIAGNOSTIC DATA (LABS, IMAGING, TESTING) - I reviewed patient records, labs, notes, testing and imaging myself where available.  Lab Results  Component Value Date   WBC 5.9 03/26/2019   HGB 13.3 03/26/2019   HCT 39.5 03/26/2019   MCV 86 03/26/2019   PLT 265 03/26/2019      Component Value Date/Time   NA 141 03/26/2019 0849   K 4.0 03/26/2019 0849   CL 103 03/26/2019 0849   CO2 23 03/26/2019 0849   GLUCOSE 96 03/26/2019 0849   BUN 12 03/26/2019 0849   CREATININE 0.63 03/26/2019 0849   CALCIUM 9.9  03/26/2019 0849   PROT 6.6 03/26/2019 0849   ALBUMIN 4.5 03/26/2019 0849   AST 21 03/26/2019 0849   ALT 20 03/26/2019 0849   ALKPHOS 96 03/26/2019 0849   BILITOT 0.4 03/26/2019 0849   GFRNONAA 94 03/26/2019 0849   GFRAA 108 03/26/2019 0849    Lab Results  Component Value Date   VITAMINB12 332 01/23/2018   Lab Results  Component Value Date   TSH 1.110 01/23/2018      ASSESSMENT AND PLAN 69 y.o. year old female  has a past medical history of Hypertension, Irritable bowel syndrome, Memory difficulty (01/23/2018), and Ovarian cancer ([place]). here with :  1: Dementia without behavioral disturbance  -Unable to complete memory test today -Continue on Namenda 10 mg twice a day -Continue to monitor symptoms -Spoke to the husband about additional help in the home.  Advised that if symptoms worsen or she develops new symptoms she should let us know.  She will follow-up in 6 months or sooner if needed.   I spent 15 minutes with the patient. 50% of this time was spent reviewing plan of care   Ward Givens, MSN, NP-C 09/29/2019, 8:09 AM [facility] [address] [place], [place] [address] [phone]

## 2019-09-29 NOTE — Progress Notes (Signed)
I have read the note, and I agree with the clinical assessment and plan.  Whitney Thomas   

## 2019-09-29 NOTE — Patient Instructions (Signed)
Your Plan:  Continue Namenda If your symptoms worsen or you develop new symptoms please let us know.   Thank you for coming to see [place] at [facility]. I hope we have been able to provide you high quality care today.  You may receive a patient satisfaction survey over the next few weeks. We would appreciate your feedback and comments so that we may continue to improve ourselves and the health of our patients.

## 2019-11-14 DIAGNOSIS — Z681 Body mass index (BMI) 19 or less, adult: Secondary | ICD-10-CM | POA: Diagnosis not present

## 2019-11-14 DIAGNOSIS — Z79899 Other long term (current) drug therapy: Secondary | ICD-10-CM | POA: Diagnosis not present

## 2019-11-14 DIAGNOSIS — I10 Essential (primary) hypertension: Secondary | ICD-10-CM | POA: Diagnosis not present

## 2019-11-14 DIAGNOSIS — R634 Abnormal weight loss: Secondary | ICD-10-CM | POA: Diagnosis not present

## 2019-11-14 DIAGNOSIS — F0150 Vascular dementia without behavioral disturbance: Secondary | ICD-10-CM | POA: Diagnosis not present

## 2019-11-14 DIAGNOSIS — G4700 Insomnia, unspecified: Secondary | ICD-10-CM | POA: Diagnosis not present

## 2019-12-02 DIAGNOSIS — G4700 Insomnia, unspecified: Secondary | ICD-10-CM | POA: Diagnosis not present

## 2019-12-02 DIAGNOSIS — F0150 Vascular dementia without behavioral disturbance: Secondary | ICD-10-CM | POA: Diagnosis not present

## 2019-12-02 DIAGNOSIS — R634 Abnormal weight loss: Secondary | ICD-10-CM | POA: Diagnosis not present

## 2019-12-02 DIAGNOSIS — Z681 Body mass index (BMI) 19 or less, adult: Secondary | ICD-10-CM | POA: Diagnosis not present

## 2020-02-16 DIAGNOSIS — I10 Essential (primary) hypertension: Secondary | ICD-10-CM | POA: Diagnosis not present

## 2020-02-16 DIAGNOSIS — F0150 Vascular dementia without behavioral disturbance: Secondary | ICD-10-CM | POA: Diagnosis not present

## 2020-02-16 DIAGNOSIS — Z681 Body mass index (BMI) 19 or less, adult: Secondary | ICD-10-CM | POA: Diagnosis not present

## 2020-03-01 DIAGNOSIS — R634 Abnormal weight loss: Secondary | ICD-10-CM | POA: Diagnosis not present

## 2020-03-01 DIAGNOSIS — E876 Hypokalemia: Secondary | ICD-10-CM | POA: Diagnosis not present

## 2020-03-01 DIAGNOSIS — F0150 Vascular dementia without behavioral disturbance: Secondary | ICD-10-CM | POA: Diagnosis not present

## 2020-03-01 DIAGNOSIS — Z681 Body mass index (BMI) 19 or less, adult: Secondary | ICD-10-CM | POA: Diagnosis not present

## 2020-03-01 DIAGNOSIS — M25512 Pain in left shoulder: Secondary | ICD-10-CM | POA: Diagnosis not present

## 2020-09-28 ENCOUNTER — Ambulatory Visit: Payer: PPO | Admitting: Adult Health

## 2020-09-29 ENCOUNTER — Ambulatory Visit: Admitting: Adult Health

## 2020-09-29 ENCOUNTER — Encounter: Payer: Self-pay | Admitting: Adult Health

## 2020-09-29 ENCOUNTER — Other Ambulatory Visit: Payer: Self-pay

## 2020-09-29 VITALS — BP 140/74 | HR 74 | Ht 63.0 in | Wt 89.0 lb

## 2020-09-29 DIAGNOSIS — R413 Other amnesia: Secondary | ICD-10-CM

## 2020-09-29 DIAGNOSIS — F0390 Unspecified dementia without behavioral disturbance: Secondary | ICD-10-CM | POA: Diagnosis not present

## 2020-09-29 NOTE — Progress Notes (Signed)
I have read the note, and I agree with the clinical assessment and plan.   K    

## 2020-09-29 NOTE — Progress Notes (Signed)
.me    PATIENT: Whitney Thomas DOB: 07-May-1951  REASON FOR VISIT: follow up HISTORY FROM: patient  HISTORY OF PRESENT ILLNESS: Today 09/29/20:  Whitney Thomas is a 70 year old female with a history of dementia.  She returns today with her husband.  She is no longer taking Namenda.  He has 2 caregivers that is with her 24 hours a day.  He continues to work full-time.  She requires assistance with all ADLs.  She is unable to carry a conversation.  Her husband manages all the finances.  He has noticed that she is more irritable and gets up more at night.  He reports that she was on a sleep medication prescribed by PCP but he is unsure what this was.  She returns today for follow-up.  09/29/19: Whitney Thomas is a 70 year old female with a history of memory disturbance.  She returns today with her husband.  She continues on Namenda 10 mg twice a day.  Her husband reports that her memory continues to decline.  He and the patient's sister is her primary caregivers.  She requires assistance with all ADLs.  She no longer operates a motor vehicle.  She does not manage any finances.  She no longer cooks.  Her husband manages her medications and appointments.  He denies any changes in her behavior.  She returns today for an evaluation.  HISTORY 03/26/19:  Whitney Thomas is a 70 year old female with a history of memory disturbance.  She returns today for follow-up.  She is currently on Namenda 10 mg twice a day.  Her and her husband both feel that her memory is gotten worse.  She is able to complete ADLs independently but requires prompting.  She does not operate a motor vehicle.  She no longer cooks.  Denies any trouble sleeping.  Her husband reports that she is more irritable and short tempered.  He has also noticed that she has a hard time following directions.  She returns today for follow-up.  REVIEW OF SYSTEMS: Out of a complete 14 system review of symptoms, the patient complains only of the following symptoms, and  all other reviewed systems are negative.  See HPI  ALLERGIES: No Known Allergies  HOME MEDICATIONS: Outpatient Medications Prior to Visit  Medication Sig Dispense Refill  . memantine (NAMENDA) 5 MG tablet Take 2 tablets (10 mg total) by mouth 2 (two) times daily. 120 tablet 5   No facility-administered medications prior to visit.    PAST MEDICAL HISTORY: Past Medical History:  Diagnosis Date  . Hypertension   . Irritable bowel syndrome   . Memory difficulty 01/23/2018  . Ovarian cancer ([place])     PAST SURGICAL HISTORY: Past Surgical History:  Procedure Laterality Date  . VAGINAL HYSTERECTOMY  2012   Dr. Aldean Ast    FAMILY HISTORY: Family History  Problem Relation Age of Onset  . Dementia Mother   . Stroke Father     SOCIAL HISTORY: Social History   Socioeconomic History  . Marital status: Married    Spouse name: Not on file  . Number of children: 1  . Years of education: 19  . Highest education level: Not on file  Occupational History  . Occupation: Retired  Tobacco Use  . Smoking status: Never Smoker  . Smokeless tobacco: Never Used  Vaping Use  . Vaping Use: Never used  Substance and Sexual Activity  . Alcohol use: Never  . Drug use: Never  . Sexual activity: Not on file  Other Topics  Concern  . Not on file  Social History Narrative   Lives w/ husband   Caffeine use: Soda daily   Right handed    Social Determinants of Health   Financial Resource Strain: Not on file  Food Insecurity: Not on file  Transportation Needs: Not on file  Physical Activity: Not on file  Stress: Not on file  Social Connections: Not on file  Intimate Partner Violence: Not on file      PHYSICAL EXAM  Vitals:   09/29/20 1457  BP: 140/74  Pulse: 74  Weight: 89 lb (40.4 kg)  Height: 5\' 3"  (1.6 m)   Body mass index is 15.77 kg/m.   MMSE - Mini Mental State Exam 09/29/2019 03/26/2019 08/19/2018  Not completed: Unable to complete (No Data) -  Orientation to  time - 0 3  Orientation to Place - 2 5  Registration - 3 3  Attention/ Calculation - 0 0  Recall - 1 3  Language- name 2 objects - 2 2  Language- repeat - 1 1  Language- follow 3 step command - 0 3  Language- read & follow direction - 1 1  Write a sentence - 0 1  Copy design - 0 0  Total score - 10 22     Generalized: Well developed, in no acute distress   Neurological examination  Mentation: Alert oriented to time, place, history taking. Follows all commands speech and language fluent Cranial nerve II-XII: Pupils were equal round reactive to light. Extraocular movements were full, visual field were full on confrontational test.  Head turning and shoulder shrug  were normal and symmetric. Motor: The motor testing reveals 5 over 5 strength of all 4 extremities. Good symmetric motor tone is noted throughout.  Sensory: Sensory testing is intact to soft touch on all 4 extremities. No evidence of extinction is noted.  Coordination: Cerebellar testing reveals good finger-nose-finger and heel-to-shin bilaterally.  Gait and station: Gait is normal.  Reflexes: Deep tendon reflexes are symmetric and normal bilaterally.   DIAGNOSTIC DATA (LABS, IMAGING, TESTING) - I reviewed patient records, labs, notes, testing and imaging myself where available.  Lab Results  Component Value Date   WBC 5.9 03/26/2019   HGB 13.3 03/26/2019   HCT 39.5 03/26/2019   MCV 86 03/26/2019   PLT 265 03/26/2019      Component Value Date/Time   NA 141 03/26/2019 0849   K 4.0 03/26/2019 0849   CL 103 03/26/2019 0849   CO2 23 03/26/2019 0849   GLUCOSE 96 03/26/2019 0849   BUN 12 03/26/2019 0849   CREATININE 0.63 03/26/2019 0849   CALCIUM 9.9 03/26/2019 0849   PROT 6.6 03/26/2019 0849   ALBUMIN 4.5 03/26/2019 0849   AST 21 03/26/2019 0849   ALT 20 03/26/2019 0849   ALKPHOS 96 03/26/2019 0849   BILITOT 0.4 03/26/2019 0849   GFRNONAA 94 03/26/2019 0849   GFRAA 108 03/26/2019 0849    Lab Results   Component Value Date   VITAMINB12 332 01/23/2018   Lab Results  Component Value Date   TSH 1.110 01/23/2018      ASSESSMENT AND PLAN 70 y.o. year old female  has a past medical history of Hypertension, Irritable bowel syndrome, Memory difficulty (01/23/2018), and Ovarian cancer ([place]). here with :  1: Dementia without behavioral disturbance  --Patient will be referred for hospice/palliative care -- Currently not taking any medication -- Unable to complete MMSE last recorded MMSE was 10/30 -- Follow-up on an as-needed basis  Advised that if symptoms worsen or she develops new symptoms she should let us know.  She will follow-up in 6 months or sooner if needed.   I spent 15 minutes with the patient. 50% of this time was spent reviewing plan of care   Ward Givens, MSN, NP-C 09/29/2020, 3:04 PM [facility] [address], [place], [place] [address] [phone]

## 2020-10-18 DIAGNOSIS — Z043 Encounter for examination and observation following other accident: Secondary | ICD-10-CM | POA: Diagnosis not present

## 2020-10-18 DIAGNOSIS — D62 Acute posthemorrhagic anemia: Secondary | ICD-10-CM | POA: Diagnosis not present

## 2020-10-18 DIAGNOSIS — Z66 Do not resuscitate: Secondary | ICD-10-CM | POA: Diagnosis not present

## 2020-10-18 DIAGNOSIS — W19XXXA Unspecified fall, initial encounter: Secondary | ICD-10-CM | POA: Diagnosis not present

## 2020-10-18 DIAGNOSIS — S72002A Fracture of unspecified part of neck of left femur, initial encounter for closed fracture: Secondary | ICD-10-CM | POA: Diagnosis not present

## 2020-10-18 DIAGNOSIS — F0390 Unspecified dementia without behavioral disturbance: Secondary | ICD-10-CM | POA: Diagnosis not present

## 2020-10-18 DIAGNOSIS — G300 Alzheimer's disease with early onset: Secondary | ICD-10-CM | POA: Diagnosis not present

## 2020-10-18 DIAGNOSIS — R102 Pelvic and perineal pain: Secondary | ICD-10-CM | POA: Diagnosis not present

## 2020-10-18 DIAGNOSIS — F0280 Dementia in other diseases classified elsewhere without behavioral disturbance: Secondary | ICD-10-CM | POA: Diagnosis not present

## 2020-10-18 DIAGNOSIS — F32A Depression, unspecified: Secondary | ICD-10-CM | POA: Diagnosis not present

## 2020-10-18 DIAGNOSIS — Z8659 Personal history of other mental and behavioral disorders: Secondary | ICD-10-CM | POA: Diagnosis not present

## 2020-10-18 DIAGNOSIS — S72142A Displaced intertrochanteric fracture of left femur, initial encounter for closed fracture: Secondary | ICD-10-CM | POA: Diagnosis not present

## 2020-10-18 DIAGNOSIS — Z79899 Other long term (current) drug therapy: Secondary | ICD-10-CM | POA: Diagnosis not present

## 2020-10-18 DIAGNOSIS — R030 Elevated blood-pressure reading, without diagnosis of hypertension: Secondary | ICD-10-CM | POA: Diagnosis not present

## 2020-10-19 DIAGNOSIS — F0280 Dementia in other diseases classified elsewhere without behavioral disturbance: Secondary | ICD-10-CM | POA: Diagnosis not present

## 2020-10-19 DIAGNOSIS — S72142A Displaced intertrochanteric fracture of left femur, initial encounter for closed fracture: Secondary | ICD-10-CM | POA: Diagnosis not present

## 2020-10-19 DIAGNOSIS — W19XXXA Unspecified fall, initial encounter: Secondary | ICD-10-CM | POA: Diagnosis not present

## 2020-10-19 DIAGNOSIS — S72002A Fracture of unspecified part of neck of left femur, initial encounter for closed fracture: Secondary | ICD-10-CM | POA: Diagnosis not present

## 2020-10-19 DIAGNOSIS — F0390 Unspecified dementia without behavioral disturbance: Secondary | ICD-10-CM | POA: Diagnosis not present

## 2020-10-27 DIAGNOSIS — G300 Alzheimer's disease with early onset: Secondary | ICD-10-CM | POA: Diagnosis not present

## 2020-10-27 DIAGNOSIS — D62 Acute posthemorrhagic anemia: Secondary | ICD-10-CM | POA: Diagnosis not present

## 2020-10-27 DIAGNOSIS — S72142A Displaced intertrochanteric fracture of left femur, initial encounter for closed fracture: Secondary | ICD-10-CM | POA: Diagnosis not present

## 2020-10-28 DIAGNOSIS — S72142A Displaced intertrochanteric fracture of left femur, initial encounter for closed fracture: Secondary | ICD-10-CM | POA: Diagnosis not present

## 2020-10-28 DIAGNOSIS — G300 Alzheimer's disease with early onset: Secondary | ICD-10-CM | POA: Diagnosis not present

## 2020-10-28 DIAGNOSIS — D62 Acute posthemorrhagic anemia: Secondary | ICD-10-CM | POA: Diagnosis not present

## 2020-10-29 DIAGNOSIS — D62 Acute posthemorrhagic anemia: Secondary | ICD-10-CM | POA: Diagnosis not present

## 2020-10-29 DIAGNOSIS — G300 Alzheimer's disease with early onset: Secondary | ICD-10-CM | POA: Diagnosis not present

## 2020-10-29 DIAGNOSIS — S72142A Displaced intertrochanteric fracture of left femur, initial encounter for closed fracture: Secondary | ICD-10-CM | POA: Diagnosis not present

## 2020-10-30 DIAGNOSIS — M6281 Muscle weakness (generalized): Secondary | ICD-10-CM | POA: Diagnosis not present

## 2020-10-30 DIAGNOSIS — I629 Nontraumatic intracranial hemorrhage, unspecified: Secondary | ICD-10-CM | POA: Diagnosis not present

## 2020-10-30 DIAGNOSIS — R41841 Cognitive communication deficit: Secondary | ICD-10-CM | POA: Diagnosis not present

## 2020-10-30 DIAGNOSIS — F802 Mixed receptive-expressive language disorder: Secondary | ICD-10-CM | POA: Diagnosis not present

## 2020-10-30 DIAGNOSIS — S72002D Fracture of unspecified part of neck of left femur, subsequent encounter for closed fracture with routine healing: Secondary | ICD-10-CM | POA: Diagnosis not present

## 2020-10-30 DIAGNOSIS — R1312 Dysphagia, oropharyngeal phase: Secondary | ICD-10-CM | POA: Diagnosis not present

## 2020-10-30 DIAGNOSIS — R2689 Other abnormalities of gait and mobility: Secondary | ICD-10-CM | POA: Diagnosis not present

## 2020-10-30 DIAGNOSIS — Z9181 History of falling: Secondary | ICD-10-CM | POA: Diagnosis not present

## 2020-11-01 DIAGNOSIS — F0280 Dementia in other diseases classified elsewhere without behavioral disturbance: Secondary | ICD-10-CM | POA: Diagnosis not present

## 2020-11-01 DIAGNOSIS — G309 Alzheimer's disease, unspecified: Secondary | ICD-10-CM | POA: Diagnosis not present

## 2020-11-01 DIAGNOSIS — E441 Mild protein-calorie malnutrition: Secondary | ICD-10-CM | POA: Diagnosis not present

## 2020-11-01 DIAGNOSIS — S72002A Fracture of unspecified part of neck of left femur, initial encounter for closed fracture: Secondary | ICD-10-CM | POA: Diagnosis not present

## 2020-11-09 DIAGNOSIS — S72002D Fracture of unspecified part of neck of left femur, subsequent encounter for closed fracture with routine healing: Secondary | ICD-10-CM | POA: Diagnosis not present

## 2020-11-09 DIAGNOSIS — E441 Mild protein-calorie malnutrition: Secondary | ICD-10-CM | POA: Diagnosis not present

## 2020-11-09 DIAGNOSIS — R2689 Other abnormalities of gait and mobility: Secondary | ICD-10-CM | POA: Diagnosis not present

## 2020-11-09 DIAGNOSIS — S72002A Fracture of unspecified part of neck of left femur, initial encounter for closed fracture: Secondary | ICD-10-CM | POA: Diagnosis not present

## 2020-11-09 DIAGNOSIS — F0280 Dementia in other diseases classified elsewhere without behavioral disturbance: Secondary | ICD-10-CM | POA: Diagnosis not present

## 2020-11-09 DIAGNOSIS — M6281 Muscle weakness (generalized): Secondary | ICD-10-CM | POA: Diagnosis not present

## 2020-11-09 DIAGNOSIS — G309 Alzheimer's disease, unspecified: Secondary | ICD-10-CM | POA: Diagnosis not present

## 2020-11-09 DIAGNOSIS — Z9181 History of falling: Secondary | ICD-10-CM | POA: Diagnosis not present

## 2020-12-09 DIAGNOSIS — G300 Alzheimer's disease with early onset: Secondary | ICD-10-CM | POA: Diagnosis not present

## 2020-12-09 DIAGNOSIS — S728X2D Other fracture of left femur, subsequent encounter for closed fracture with routine healing: Secondary | ICD-10-CM | POA: Diagnosis not present

## 2020-12-09 DIAGNOSIS — R296 Repeated falls: Secondary | ICD-10-CM | POA: Diagnosis not present

## 2020-12-09 DIAGNOSIS — R5381 Other malaise: Secondary | ICD-10-CM | POA: Diagnosis not present

## 2022-09-11 DEATH — deceased
